# Patient Record
Sex: Male | Born: 1975 | Race: White | Hispanic: No | Marital: Married | Smoking: Former smoker
Health system: Southern US, Community
[De-identification: ages and names within clinical notes are randomized; demographics above are authoritative.]

## PROBLEM LIST (undated history)

## (undated) DIAGNOSIS — F32A Depression, unspecified: Secondary | ICD-10-CM

## (undated) DIAGNOSIS — F419 Anxiety disorder, unspecified: Secondary | ICD-10-CM

## (undated) DIAGNOSIS — F329 Major depressive disorder, single episode, unspecified: Secondary | ICD-10-CM

## (undated) DIAGNOSIS — Z9189 Other specified personal risk factors, not elsewhere classified: Secondary | ICD-10-CM

## (undated) HISTORY — DX: Anxiety disorder, unspecified: F41.9

## (undated) HISTORY — DX: Other specified personal risk factors, not elsewhere classified: Z91.89

## (undated) HISTORY — DX: Depression, unspecified: F32.A

## (undated) HISTORY — DX: Major depressive disorder, single episode, unspecified: F32.9

---

## 2001-09-19 ENCOUNTER — Encounter: Payer: Self-pay | Admitting: Emergency Medicine

## 2001-09-19 ENCOUNTER — Emergency Department (HOSPITAL_COMMUNITY): Admission: EM | Admit: 2001-09-19 | Discharge: 2001-09-19 | Payer: Self-pay | Admitting: Emergency Medicine

## 2003-01-24 ENCOUNTER — Ambulatory Visit (HOSPITAL_COMMUNITY): Admission: RE | Admit: 2003-01-24 | Discharge: 2003-01-24 | Payer: Self-pay | Admitting: Family Medicine

## 2003-01-24 ENCOUNTER — Emergency Department (HOSPITAL_COMMUNITY): Admission: EM | Admit: 2003-01-24 | Discharge: 2003-01-24 | Payer: Self-pay | Admitting: Physical Therapy

## 2004-11-04 ENCOUNTER — Emergency Department: Payer: Self-pay | Admitting: Emergency Medicine

## 2004-11-14 ENCOUNTER — Ambulatory Visit (HOSPITAL_BASED_OUTPATIENT_CLINIC_OR_DEPARTMENT_OTHER): Admission: RE | Admit: 2004-11-14 | Discharge: 2004-11-14 | Payer: Self-pay | Admitting: Orthopedic Surgery

## 2009-06-22 ENCOUNTER — Ambulatory Visit: Payer: Self-pay

## 2009-06-22 ENCOUNTER — Ambulatory Visit: Payer: Self-pay | Admitting: Cardiology

## 2009-06-22 ENCOUNTER — Encounter (INDEPENDENT_AMBULATORY_CARE_PROVIDER_SITE_OTHER): Payer: Self-pay | Admitting: Internal Medicine

## 2009-06-22 ENCOUNTER — Encounter: Payer: Self-pay | Admitting: Cardiology

## 2010-07-18 ENCOUNTER — Ambulatory Visit: Payer: Self-pay | Admitting: Family Medicine

## 2010-07-18 DIAGNOSIS — F341 Dysthymic disorder: Secondary | ICD-10-CM

## 2010-07-18 DIAGNOSIS — Z9189 Other specified personal risk factors, not elsewhere classified: Secondary | ICD-10-CM | POA: Insufficient documentation

## 2010-11-13 NOTE — Assessment & Plan Note (Signed)
Summary: NEW PATIENT APPT/[facility]   Vital Signs:  Patient profile:   35 year old male Height:      69 inches Weight:      147.0 pounds BMI:     21.79 Temp:     98.6 degrees F oral Pulse rate:   76 / minute Pulse rhythm:   regular BP sitting:   108 / 72  (left arm) Cuff size:   regular  Vitals Entered By: Benny Lennert CMA Duncan Dull) (July 18, 2010 1:51 PM)  History of Present Illness: Chief complaint new patient to be established   35 year old male:  Was seeing Dr. Amaryllis Dyke.  had some blood in his semen - had a big work-up to evaluate, neg STD testing, neg HIV, etc. trying to have a child right now.  does shop - in [place].  every once in a while has some pain with doing bench presses. Every once in a while will get more of a sharp anterior chest pain neg stress test ordered in the past.     Preventive Screening-Counseling & Management  Alcohol-Tobacco     Smoking Status: current  Caffeine-Diet-Exercise     Does Patient Exercise: yes      Drug Use:  no.    Allergies (verified): No Known Drug Allergies  Past History:  Past Medical History: DEPRESSION/ANXIETY (ICD-300.4) CHICKENPOX, HX OF (ICD-V15.9) R inguinal hernia    Psychiatry = Dr. Evelene Croon  Past Surgical History: none  Family History: Family History of Alcoholism/Addiction Family History of Arthritis Family History of Prostate CA 1st degree relative <50  Social History: Occupation: Systems analyst, [employer] Married Current Smoker Alcohol use-yes Drug use-no Regular exercise-yes Occupation:  employed Drug Use:  no Does Patient Exercise:  yes  Review of Systems  General: Denies fever, chills, sweats, and anorexia. Eyes: Denies blurring. ENT: Denies earache, ear discharge, decreased hearing, nasal congestion, and sore throat. CV: Denies chest pains, dyspnea on exertion, palpitations, and syncope. Resp: Denies cough, cough with exercise, dyspnea at rest, excessive sputum, nighttime cough or wheeze, and  wheezing GI: Denies nausea, vomiting, diarrhea, constipation, change in bowel habits, abdominal pain, melena, BRBPR GU: dysuria, discharge, frequency,genital sores, STD concern. MS: no back pain, joint pain, stiffness, and arthritis - CHEST PAIN, MSK EVERY SO OFTEN Derm: No rash, itching, and dryness Neuro: No abnormal gait, frequent headaches, paresthesias, seizures, vertigo, and weakness Psych: No anxiety, behavioral problems, compulsive behavior, depression, hyperactivity, and inattentive. Endo: No polydipsia, polyphagia, polyuria, and unusual weight change Heme: No bruising or LAD Allergy: No urticaria or hayfever   Otherwise, the pertinent positives and negatives are listed above and in the HPI, otherwise a full review of systems has been reviewed and is negative unless noted positive.   Physical Exam  General:  Well-developed,well-nourished,in no acute distress; alert,appropriate and cooperative throughout examination Head:  Normocephalic and atraumatic without obvious abnormalities. No apparent alopecia or balding. Eyes:  pupils equal, pupils round, pupils reactive to light, and pupils react to accomodation.   Ears:  External ear exam shows no significant lesions or deformities.  Otoscopic examination reveals inflammed canals, some wax, minimally ttp Nose:  External nasal examination shows no deformity or inflammation. Nasal mucosa are pink and moist without lesions or exudates. Mouth:  Oral mucosa and oropharynx without lesions or exudates.  Teeth in good repair. Neck:  No deformities, masses, or tenderness noted. Chest Wall:  No deformities, masses, tenderness or gynecomastia noted. Lungs:  Normal respiratory effort, chest expands symmetrically. Lungs are clear  to auscultation, no crackles or wheezes. Heart:  Normal rate and regular rhythm. S1 and S2 normal without gallop, murmur, click, rub or other extra sounds. Abdomen:  Bowel sounds positive,abdomen soft and non-tender without  masses, organomegaly or hernias noted. Genitalia:  R inguinal hernia  circumcised, no scrotal masses, no cutaneous lesions, and no urethral discharge.   Msk:  normal ROM and no crepitation.   Extremities:  No clubbing, cyanosis, edema, or deformity noted with normal full range of motion of all joints.   Neurologic:  alert & oriented X3 and gait normal.   Skin:  Intact without suspicious lesions or rashes Cervical Nodes:  No lymphadenopathy noted Inguinal Nodes:  No significant adenopathy Psych:  Cognition and judgment appear intact. Alert and cooperative with normal attention span and concentration. No apparent delusions, illusions, hallucinations   Impression & Recommendations:  Problem # 1:  HEALTH MAINTENANCE EXAM (ICD-V70.0) The patient's preventative maintenance and recommended screening tests for an annual wellness exam were reviewed in full today. Brought up to date unless services declined.  Counselled on the importance of diet, exercise, and its role in overall health and mortality. The patient's FH and SH was reviewed, including their home life, tobacco status, and drug and alcohol status.   We will obtain records from the patient's prior physicians.   mild oe, drops  Complete Medication List: 1)  Alprazolam 0.25 Mg Tabs (Alprazolam) .... Patient not sure of the doseage 2)  Cortisporin Otic  .Marland Kitchen.. 4 drops each ear three times a day for 7 days Prescriptions: CORTISPORIN OTIC 4 drops each ear three times a day for 7 days  #1 x 0   Entered and Authorized by:   Hannah Beat MD   Signed by:   Hannah Beat MD on 07/18/2010   Method used:   Print then Give to Patient   RxID:   [id number]   Current Allergies (reviewed today): No known allergies

## 2011-03-01 NOTE — Op Note (Signed)
Andrew Mcdonald, COOPRIDER             ACCOUNT NO.:  [id number]   MEDICAL RECORD NO.:  [id number]          PATIENT TYPE:  AMB   LOCATION:  [facility]                          FACILITY:  [facility]   PHYSICIAN:  Feliberto Gottron. Turner Daniels, M.D.   DATE OF BIRTH:  1975-12-24   DATE OF PROCEDURE:  11/14/2004  DATE OF DISCHARGE:  11/14/2004                                 OPERATIVE REPORT   PREOPERATIVE DIAGNOSIS:  Possible bucket-handle tear of the right knee  medial meniscus, possible anterior cruciate ligament tear.   POSTOPERATIVE DIAGNOSIS:  Right knee synovitis and partial anterior cruciate  ligament tear.   PROCEDURE:  Right knee arthroscopic synovectomy and debridement of partial-  thickness anterior cruciate ligament tear.   ANESTHETIC:  General LMA.   SURGEON:  Feliberto Gottron. Turner Daniels, M.D.   FIRST ASSISTANT:   ESTIMATED BLOOD LOSS:  Minimal.   FLUID REPLACEMENT:  7 cc crystalloid.   INDICATIONS FOR PROCEDURE:  A 35 year old man who hyperextended his right  knee with a fairly loud pop 10 days prior to surgery. He was seen in my  office and had a locked right knee, could not extend past 30 or flex past  about 50 degrees.  Plain radiographs were unremarkable. I did remove about  120 cc of blood without fat bodies from his knee and his knee remained  locked despite the instillation of Xylocaine and Marcaine. He is taken for  arthroscopic evaluation and treatment of a presumed medial meniscal tear,  locked and possible ACL tear.   DESCRIPTION OF PROCEDURE:  The patient identified by armband, taken the  operating room at [facility]. Appropriate monitors were  attached and general LMA anesthesia induced with the patient in supine  position.  Even after the induction of the general LMA anesthesia, his knee  would not come to full extension and when we attempted to, he would actually  start to shake a little bit under the anesthetic. In any event, tourniquet  was applied high to the right  leg, a lateral post applied to the table and  the right lower extremity prepped, draped in usual sterile fashion from the  ankle to the tourniquet. The tourniquet was never used.  Using a #11 blade,  standard inferomedial, inferolateral peripatellar portals were made allowing  evacuation of more recurrent hematoma.  The suprapatellar pouch, patella,  and trochlea only had some synovitis that was not severe in this region and  did not require debridement. Moving the medial compartment, the articular  and meniscal cartilages were in good condition.  In the prepatellar fat pad  region there was extensive synovitis to all the way up to the ACL which had  about 5 to 10% of the fibers torn and these were cleaned.  After this was  removed the knee had remarkably better motion. Moving to the lateral  compartment, the articular and meniscal cartilages were good condition.  There was no bucket-handle tear. The gutters were cleared medial and  laterally. The scope was taken medial and lateral to the PCL and no  posterior horn tears were identified.  The knee  was then irrigated out  normal saline solution. The arthroscopic  instruments were removed.  A dressing of Xeroform, 4 x 4 dressing sponges,  Webril and Ace wrap applied. The knee did now come to full extension after  removal of the partial ACL tear and the extensive synovitis.  The patient  was awakened and taken to recovery room without difficulty.      FJR/MEDQ  D:  11/26/2004  T:  11/26/2004  Job:  [id number]

## 2011-05-16 ENCOUNTER — Encounter: Payer: Self-pay | Admitting: Family Medicine

## 2011-05-16 ENCOUNTER — Ambulatory Visit (INDEPENDENT_AMBULATORY_CARE_PROVIDER_SITE_OTHER): Payer: 59 | Admitting: Family Medicine

## 2011-05-16 VITALS — BP 104/72 | HR 56 | Temp 97.8°F | Wt 143.2 lb

## 2011-05-16 DIAGNOSIS — H60399 Other infective otitis externa, unspecified ear: Secondary | ICD-10-CM

## 2011-05-16 DIAGNOSIS — H60312 Diffuse otitis externa, left ear: Secondary | ICD-10-CM | POA: Insufficient documentation

## 2011-05-16 DIAGNOSIS — H6090 Unspecified otitis externa, unspecified ear: Secondary | ICD-10-CM | POA: Insufficient documentation

## 2011-05-16 MED ORDER — OFLOXACIN 0.3 % OT SOLN: 10.0000 [drp] | Freq: Every day | OTIC | Status: AC

## 2011-05-16 NOTE — Assessment & Plan Note (Signed)
Treat with ofloxacin. Advised out of water until improved. Advised if not improving as expected, return for further eval.  Other red flags to return discussed.

## 2011-05-16 NOTE — Progress Notes (Signed)
  Subjective:    Patient ID: Andrew Mcdonald, male    DOB: 1975/12/03, 35 y.o.   MRN: [id number]  HPI CC: "ear infection"  1 wk h/o R side ear sxs.  Muffled, small amt draining from ear, some popping.  Mild headaches, dizziness.  More mucous production when blowing nose than normal.  Minimal coughing.  Some decreased hearing comes and goes.  Started 1wk after swimming in parent's pond, states clean pond.  No fevers/chills, ST.  No tinnitus.  Has been taking neomycin/poly/HC drops x3 days (leftover from previous outer ear infection)  No sick contacts at home.  Smoking 1/3 ppd.  Going on vacation to beach this weekend.  Review of Systems Per HPI    Objective:   Physical Exam  Nursing note and vitals reviewed. Constitutional: He appears well-developed and well-nourished. No distress.  HENT:  Head: Normocephalic and atraumatic.  Right Ear: Hearing normal. There is drainage.  Left Ear: Hearing, tympanic membrane, external ear and ear canal normal.  Nose: Nose normal. No mucosal edema or rhinorrhea.  Mouth/Throat: Uvula is midline, oropharynx is clear and moist and mucous membranes are normal. No oropharyngeal exudate, posterior oropharyngeal edema, posterior oropharyngeal erythema or tonsillar abscesses.       + pus covering TM on right.  Cleaned with curette, able to visualize small portion of TM, no perf, good light reflex.  Eyes: Conjunctivae and EOM are normal. Pupils are equal, round, and reactive to light. No scleral icterus.  Neck: Normal range of motion. Neck supple.  Lymphadenopathy:    He has no cervical adenopathy.  Psychiatric: He has a normal mood and affect.          Assessment & Plan:

## 2011-05-16 NOTE — Patient Instructions (Addendum)
Outer ear infection- treat with antibiotic drops daily.  Lay on left side 20 min after placing drops, or cover ear with cotton swab after using antibiotic. If not improving as expected, worsening pain, or hearing changes, please return to be seen again. Swimmer's Ear (Otitis Externa) Otitis externa ("swimmer's ear") is a germ (bacterial) or fungal infection of the outer ear canal (from the eardrum to the outside of the ear). Swimming in dirty water may cause swimmer's ear. It also may be caused by moisture in the ear from water remaining after swimming or bathing. Often the first signs of infection may be itching in the ear canal. This may progress to ear canal swelling, redness, and pus drainage which may be signs of infection. HOME CARE INSTRUCTIONS  Apply the antibiotic drops to the ear canal as prescribed by your doctor.   This can be a very painful medical condition. A strong pain reliever may be prescribed.   Only take over-the-counter or prescription medicines for pain, discomfort, or fever as directed by your caregiver.   If your caregiver has given you a follow-up appointment, it is very important to keep that appointment. Not keeping the appointment could result in a chronic or permanent injury, pain, hearing loss and disability. If there is any problem keeping the appointment, you must call back to this facility for assistance.  PREVENTION  It is important to keep your ear dry. Use the corner of a towel to wick water out of the ear canal after swimming or bathing.   Avoid scratching in your ear. This can damage the ear canal or remove the protective wax lining the canal and make it easier for germs (bacteria) or a fungus to grow.   You may use ear drops made of rubbing alcohol and vinegar after swimming to prevent future "swimmer ear" infections. Make up a small bottle of equal parts white vinegar and alcohol. Put 3 or 4 drops into each ear after swimming.   Avoid swimming in lakes,  polluted water, or poorly chlorinated pools.  SEEK MEDICAL CARE IF:  An oral temperature above 101 develops.   Your ear is still painful after 3 days and shows signs of getting worse (redness, swelling, pain, or pus).  MAKE SURE YOU:   Understand these instructions.   Will watch your condition.   Will get help right away if you are not doing well or get worse.  Document Released: 09/30/2005 Document Re-Released: 09/12/2008 [facility] Patient Information 2011 [place], [place].

## 2011-08-09 ENCOUNTER — Ambulatory Visit (INDEPENDENT_AMBULATORY_CARE_PROVIDER_SITE_OTHER): Payer: 59 | Admitting: Family Medicine

## 2011-08-09 ENCOUNTER — Encounter: Payer: Self-pay | Admitting: Family Medicine

## 2011-08-09 VITALS — BP 116/70 | HR 60 | Temp 98.0°F | Wt 147.8 lb

## 2011-08-09 DIAGNOSIS — H60399 Other infective otitis externa, unspecified ear: Secondary | ICD-10-CM

## 2011-08-09 DIAGNOSIS — H6090 Unspecified otitis externa, unspecified ear: Secondary | ICD-10-CM

## 2011-08-09 MED ORDER — OFLOXACIN 0.3 % OT SOLN: 10.0000 [drp] | Freq: Every day | OTIC | Status: AC

## 2011-08-09 NOTE — Patient Instructions (Signed)
Looks like external ear infection again. Treat with ofloxacin daily for 7 days. Please notify us if not improving as expected.

## 2011-08-09 NOTE — Assessment & Plan Note (Signed)
External otitis. Treat with oflox. No perf today. Update if not improving.

## 2011-08-09 NOTE — Progress Notes (Signed)
  Subjective:    Patient ID: Andrew Mcdonald, male    DOB: Apr 08, 1976, 35 y.o.   MRN: [id number]  HPI CC: R ear ache  3d h/o ear pain and drainage, mild dizziness.  + congestion, RN.  + coughing some.  No hearing changes.  Tends to get ear infections.  Smoking 1/2 ppd.  No recent swimming.  No fevers/chills,   Seen here 05/2011 - treated for otitis externa with ofloxacin, improved.  Review of Systems Per HPI    Objective:   Physical Exam  Nursing note and vitals reviewed. Constitutional: He appears well-developed and well-nourished. No distress.  HENT:  Head: Normocephalic and atraumatic.  Left Ear: Hearing, tympanic membrane, external ear and ear canal normal.  Mouth/Throat: Oropharynx is clear and moist. No oropharyngeal exudate.       Wax in left ear obscuring most of TM R ear with external canal irritation as well as white discharge. TM pearly grey, did not visualize perf.  Eyes: Conjunctivae and EOM are normal. Pupils are equal, round, and reactive to light. No scleral icterus.  Neck: Normal range of motion. Neck supple.  Lymphadenopathy:    He has no cervical adenopathy.  Skin: Skin is warm and dry. No rash noted.  Psychiatric: He has a normal mood and affect.          Assessment & Plan:

## 2011-11-05 ENCOUNTER — Telehealth: Payer: Self-pay | Admitting: Family Medicine

## 2011-11-05 MED ORDER — CIPROFLOXACIN HCL 500 MG PO TABS: 500.0000 mg | ORAL_TABLET | Freq: Two times a day (BID) | ORAL | Status: AC

## 2011-11-05 NOTE — Telephone Encounter (Signed)
Did he not get at all better after abx drops?  Has had 2 cases of uncomplicated external otitis last year, 1 in 2011.  Last case was 07/2011.  First time treated with cortisporin, next 2 with oflox. Best would be (and i'd prefer) to come in for further evaluation given last episode was 3 + mo ago.  Can he come in tomorrow?  (could see PCP for this).  ?fungal vs other dx.   If refuses, can try treatment with cipro 500mg  bid x 1 wk (i sent in).

## 2011-11-05 NOTE — Telephone Encounter (Signed)
Message left for patient to return my call.  

## 2011-11-05 NOTE — Telephone Encounter (Signed)
Pt called has been seen 3 times for right ear pain and is becoming painful in both ears. Was given ear drops and those have not worked. Pt would like a antibiotic instead of ear drops.

## 2011-11-05 NOTE — Telephone Encounter (Signed)
Spoke with patient. He said he hasn't really gotten better since his last visit and feels like he has a lot of drainage. I advised he should be rechecked to make sure the cause isn't something else. He scheduled a recheck for tomorrow with Dr. Patsy Lager

## 2011-11-06 ENCOUNTER — Ambulatory Visit (INDEPENDENT_AMBULATORY_CARE_PROVIDER_SITE_OTHER): Payer: 59 | Admitting: Family Medicine

## 2011-11-06 ENCOUNTER — Encounter: Payer: Self-pay | Admitting: Family Medicine

## 2011-11-06 VITALS — BP 120/78 | HR 68 | Temp 98.1°F | Ht 67.0 in | Wt 147.0 lb

## 2011-11-06 DIAGNOSIS — H6690 Otitis media, unspecified, unspecified ear: Secondary | ICD-10-CM

## 2011-11-06 DIAGNOSIS — M7580 Other shoulder lesions, unspecified shoulder: Secondary | ICD-10-CM

## 2011-11-06 DIAGNOSIS — M67919 Unspecified disorder of synovium and tendon, unspecified shoulder: Secondary | ICD-10-CM

## 2011-11-06 MED ORDER — AMOXICILLIN 500 MG PO CAPS: 1000.0000 mg | ORAL_CAPSULE | Freq: Two times a day (BID) | ORAL | Status: AC

## 2011-11-06 NOTE — Progress Notes (Signed)
  Patient Name: Andrew Mcdonald Date of Birth: September 20, 1976 Age: 36 y.o. Medical Record Number: [id number] Gender: male Date of Encounter: 11/06/2011  History of Present Illness:  Andrew Mcdonald is a 36 y.o. very pleasant male patient who presents with the following:  R ear is hurting a lot, L ear a little bit.  Having a little trouble hearing and draining some   Back tooth is hurting.  A little   Pleasant gentleman who has had several cases of otitis externa within the last year. He has been treated with topical antibiotic drops. Right now he is having a return of some symptoms in a deep ache in his ear. He has had some dripping in his ear. No pain with movement of the ear itself as well as no pain with moving the tragus.  He also is having some left-sided shoulder pain has been in intermittent for a long time. Pain with throwing a baseball or softball. 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  Past Medical History, Surgical History, Social History, Family History, Problem List, Medications, and Allergies have been reviewed and updated if relevant.  Review of Systems: ROS: GEN: Acute illness details above GI: Tolerating PO intake GU: maintaining adequate hydration and urination Pulm: No SOB Interactive and getting along well at home.  Otherwise, ROS is as per the HPI.   Physical Examination: Filed Vitals:   11/06/11 1126  BP: 120/78  Pulse: 68  Temp: 98.1 F (36.7 C)  TempSrc: Oral  Height: 5\' 7"  (1.702 m)  Weight: 147 lb (66.679 kg)  SpO2: 98%    Body mass index is 23.02 kg/(m^2).   Gen: WDWN, NAD; A & O x3, cooperative. Pleasant.Globally Non-toxic HEENT: Normocephalic and atraumatic. Throat clear, w/o exudate, R canal is mildly inflamed. Nontender with movement of the entire ear or with movement of the tragus. Tympanic membrane is full, full fluid, and bulging. No rhinnorhea.  MMM Frontal sinuses: NT Max sinuses: NT NECK: Anterior cervical  LAD is  absent CV: RRR, No M/G/R, cap refill <2 sec PULM: Breathing comfortably in no respiratory distress. no wheezing, crackles, rhonchi EXT: No c/c/e PSYCH: Friendly, good eye contact MSK: Nml gait  Left shoulder with full range of motion. Upper extremity strength is 5/5. Negative drop test. Leanord Asal test is positive. Neer test is positive.   Assessment and Plan: 1. Otitis media  amoxicillin (AMOXIL) 500 MG capsule  2. Rotator cuff tendinitis      abx Reviewed throwers twelve

## 2012-02-18 ENCOUNTER — Ambulatory Visit (INDEPENDENT_AMBULATORY_CARE_PROVIDER_SITE_OTHER): Payer: 59 | Admitting: Family Medicine

## 2012-02-18 ENCOUNTER — Encounter: Payer: Self-pay | Admitting: Family Medicine

## 2012-02-18 VITALS — BP 112/70 | HR 68 | Temp 97.5°F | Wt 147.2 lb

## 2012-02-18 DIAGNOSIS — H60399 Other infective otitis externa, unspecified ear: Secondary | ICD-10-CM

## 2012-02-18 DIAGNOSIS — H6090 Unspecified otitis externa, unspecified ear: Secondary | ICD-10-CM

## 2012-02-18 NOTE — Progress Notes (Signed)
  Subjective:    Patient ID: Andrew Mcdonald, male    DOB: 23-Mar-1976, 36 y.o.   MRN: [id number]  HPI CC: ear pain  H/o recurrent external and middle ear infections in past, last otitis media was 10/2011, eval by PCP.  Comes and goes, today not bothering him.  But yesterday especially bad.  States never fully resolved after treatment 10/2011 with 10d course of amoxicillin 1000mg  bid.    Also seen here 05/2011 and 07/2011 with external ear infections, treated with ofloxacin both times with only partial resolution of sxs.  Feels fluid building up in right ear, muffled hearing R>L.    No fevers/chills.  + nasal congestion, coughing, mild tooth pain on right.  Smokes 1/3 ppd. No recent swimming.  Medications and allergies reviewed and updated in chart.  Past histories reviewed and updated if relevant as below. Patient Active Problem List  Diagnoses  . DEPRESSION/ANXIETY  . CHICKENPOX, HX OF  . External otitis   Past Medical History  Diagnosis Date  . Anxiety and depression   . Unspecified personal history presenting hazards to health   . Inguinal hernia     right   No past surgical history on file. History  Substance Use Topics  . Smoking status: Current Everyday Smoker -- 0.5 packs/day for 15 years    Types: Cigarettes  . Smokeless tobacco: Not on file  . Alcohol Use: Yes     Occasional   Family History  Problem Relation Age of Onset  . Alcohol abuse      family history  . Arthritis      family history  . Prostate cancer      1st degree relative <50   No Known Allergies Current Outpatient Prescriptions on File Prior to Visit  Medication Sig Dispense Refill  . ALPRAZolam (XANAX) 0.25 MG tablet Take 0.25 mg by mouth as directed.           Review of Systems Per HPI    Objective:   Physical Exam  Constitutional: He appears well-developed and well-nourished. No distress.  HENT:  Head: Normocephalic and atraumatic.  Right Ear: Hearing and external ear normal.    Left Ear: Hearing and external ear normal.  Nose: Nose normal. No mucosal edema or rhinorrhea. Right sinus exhibits no maxillary sinus tenderness and no frontal sinus tenderness. Left sinus exhibits no maxillary sinus tenderness and no frontal sinus tenderness.  Mouth/Throat: Uvula is midline, oropharynx is clear and moist and mucous membranes are normal. No oropharyngeal exudate, posterior oropharyngeal edema, posterior oropharyngeal erythema or tonsillar abscesses.       Left external canal covered by dark yellow cerumen, cleaning attempted with plastic curette but poorly tolerated, able to visualize TM - pearly grey, no bulging or erythema Right external canal full of white purulent material, unable to clean out fully but portion of TM visualized - white/grey as well, no erythema.  Eyes: Conjunctivae and EOM are normal. Pupils are equal, round, and reactive to light. No scleral icterus.  Neck: Normal range of motion. Neck supple.  Lymphadenopathy:    He has no cervical adenopathy.  Skin: Skin is warm and dry. No rash noted.      Assessment & Plan:

## 2012-02-18 NOTE — Patient Instructions (Signed)
Pass by Marion's office for referral to ENT.

## 2012-02-19 NOTE — Assessment & Plan Note (Signed)
Recurrent otitis externa over last several months, always R>L side. Today significant debris bilaterally, likely impeding ability of abx drops to affect cure.  Prior treated with cortisporin then oflox drops. Concern for chronic external otitis vs other cause (ie fungal) Will refer to ENT for external canal cleaning as well as further eval and treatment.

## 2012-03-05 ENCOUNTER — Ambulatory Visit: Payer: Self-pay | Admitting: Otolaryngology

## 2012-05-11 ENCOUNTER — Ambulatory Visit (INDEPENDENT_AMBULATORY_CARE_PROVIDER_SITE_OTHER): Payer: 59 | Admitting: Family Medicine

## 2012-05-11 ENCOUNTER — Encounter: Payer: Self-pay | Admitting: Family Medicine

## 2012-05-11 VITALS — BP 100/74 | HR 60 | Temp 98.5°F | Ht 67.5 in | Wt 144.8 lb

## 2012-05-11 DIAGNOSIS — Z0000 Encounter for general adult medical examination without abnormal findings: Secondary | ICD-10-CM

## 2012-05-11 DIAGNOSIS — Z139 Encounter for screening, unspecified: Secondary | ICD-10-CM

## 2012-05-11 NOTE — Progress Notes (Signed)
Nature conservation officer at [facility] [address] [place] [address] Phone: [phone] Fax: [phone]  Date:  05/11/2012   Name:  Andrew Mcdonald   DOB:  April 17, 1976   MRN:  [id number]  PCP:  Hannah Beat, MD    Chief Complaint: Annual Exam   History of Present Illness:  Andrew Mcdonald is a 36 y.o. very pleasant male patient who presents with the following:  Quitting smoking. Had tetanus at [facility].   Preventative Health Maintenance Visit:  Health Maintenance Summary Reviewed and updated, unless pt declines services.  Tobacco History Reviewed. Alcohol: No concerns, no excessive use Exercise Habits: very active STD concerns: no risk or activity to increase risk Drug Use: None Encouraged self-testicular check  Health Maintenance  Topic Date Due  . Influenza Vaccine  07/14/2012  . Tetanus/tdap  08/11/2021   Extensive bloodwork during conception of child and infertility, all of which was normal.  Past Medical History, Surgical History, Social History, Family History, Problem List, Medications, and Allergies have been reviewed and updated if relevant.  Current Outpatient Prescriptions on File Prior to Visit  Medication Sig Dispense Refill  . ALPRAZolam (XANAX) 0.25 MG tablet Take 0.25 mg by mouth 4 (four) times daily as needed.         Review of Systems:  General: Denies fever, chills, sweats. No significant weight loss. Eyes: Denies blurring,significant itching ENT: Denies earache, sore throat, and hoarseness. Cardiovascular: Denies chest pains, palpitations, dyspnea on exertion Respiratory: Denies cough, dyspnea at rest,wheeezing Breast: no concerns about lumps GI: Denies nausea, vomiting, diarrhea, constipation, change in bowel habits, abdominal pain, melena, hematochezia GU: Denies penile discharge, ED, urinary flow / outflow problems. No STD concerns. Musculoskeletal: Denies back pain, joint pain Derm: Denies rash, itching Neuro: Denies   paresthesias, frequent falls, frequent headaches Psych: Denies depression, anxiety Endocrine: Denies cold intolerance, heat intolerance, polydipsia Heme: Denies enlarged lymph nodes Allergy: No hayfever   Physical Examination: Filed Vitals:   05/11/12 1204  BP: 100/74  Pulse: 60  Temp: 98.5 F (36.9 C)   Filed Vitals:   05/11/12 1204  Height: 5' 7.5" (1.715 m)  Weight: 144 lb 12 oz (65.658 kg)   Body mass index is 22.34 kg/(m^2). Ideal Body Weight: Weight in (lb) to have BMI = 25: 161.7   GEN: well developed, well nourished, no acute distress Eyes: conjunctiva and lids normal, PERRLA, EOMI ENT: TM clear, nares clear, oral exam WNL Neck: supple, no lymphadenopathy, no thyromegaly, no JVD Pulm: clear to auscultation and percussion, respiratory effort normal CV: regular rate and rhythm, S1-S2, no murmur, rub or gallop, no bruits, peripheral pulses normal and symmetric, no cyanosis, clubbing, edema or varicosities GI: soft, non-tender; no hepatosplenomegaly, masses; active bowel sounds all quadrants GU: defer Lymph: no cervical, axillary or inguinal adenopathy MSK: gait normal, muscle tone and strength WNL, no joint swelling, effusions, discoloration, crepitus  SKIN: clear, good turgor, color WNL, no rashes, lesions, or ulcerations Neuro: normal mental status, normal strength, sensation, and motion Psych: alert; oriented to person, place and time, normally interactive and not anxious or depressed in appearance.   Assessment and Plan: 1. Routine general medical examination at a health care facility    2. Screening  TB Skin Test    The patient's preventative maintenance and recommended screening tests for an annual wellness exam were reviewed in full today. Brought up to date unless services declined.  Counselled on the importance of diet, exercise, and its role in overall health and  mortality. The patient's FH and SH was reviewed, including their home life, tobacco status,  and drug and alcohol status.   Forms completed, he will return for PPD reading Wed-Thurs.  Hannah Beat, MD

## 2012-05-14 LAB — TB SKIN TEST
Induration: 0 mm
TB Skin Test: NEGATIVE

## 2012-09-30 IMAGING — CT CT ORBITS WITHOUT CONTRAST
3 of 4 series · 16 of 30 positions shown, 18 images · non-contrast
Comparison: none

REASON FOR EXAM: Otorrhea Possible Cholesteatoma
COMMENTS:

[Series 4: right coronal · axial · 0.19mm/px · z∈[+327,+396]mm · 7 of 154 slices shown, 9 images]
[im 20/154  brain]
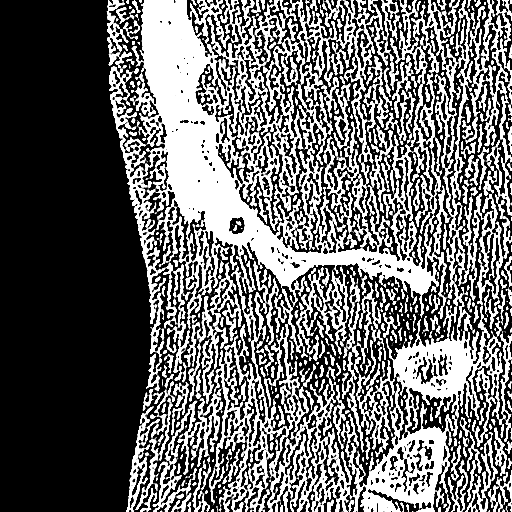
[im 20/154  bone]
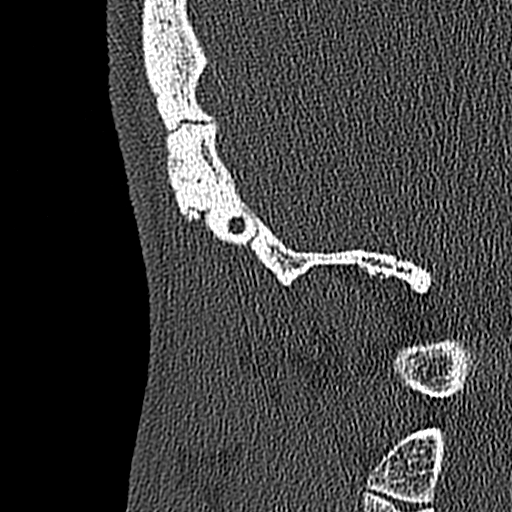
[im 39/154  bone]
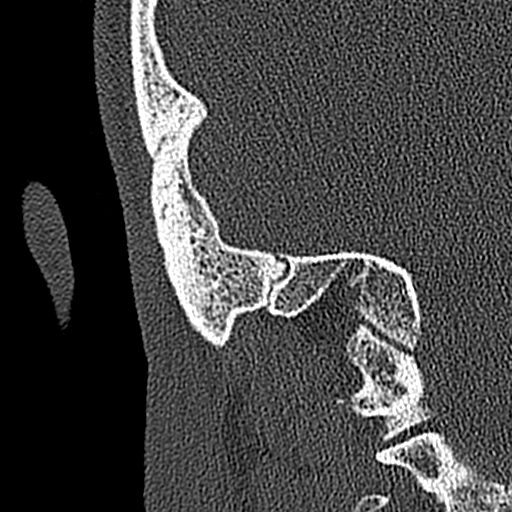
[im 58/154  bone]
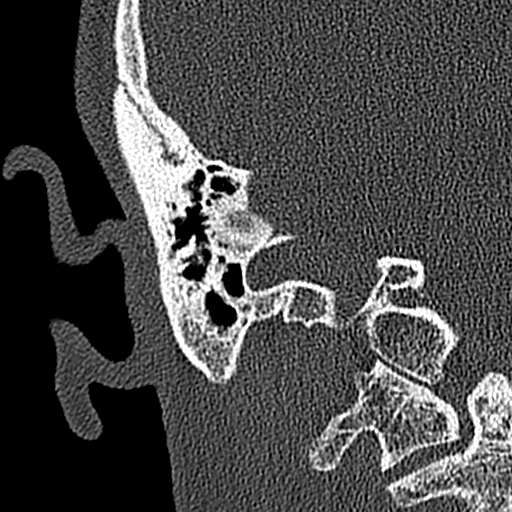
[im 77/154  bone]
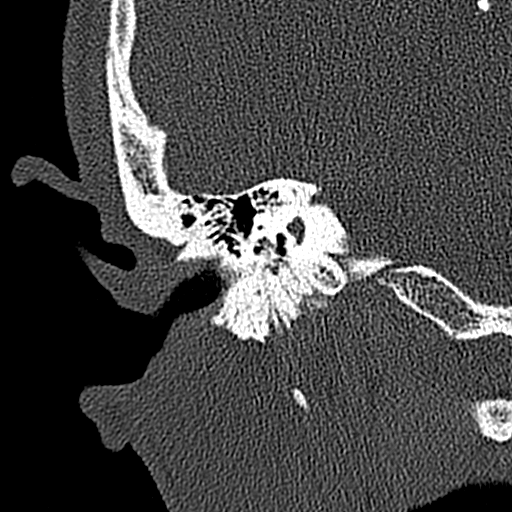
[im 96/154  brain]
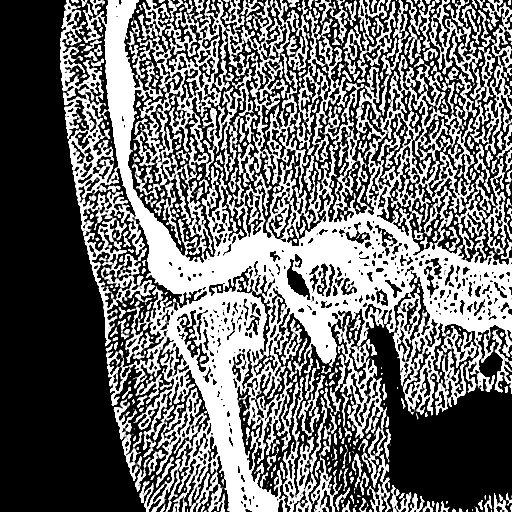
[im 96/154  bone]
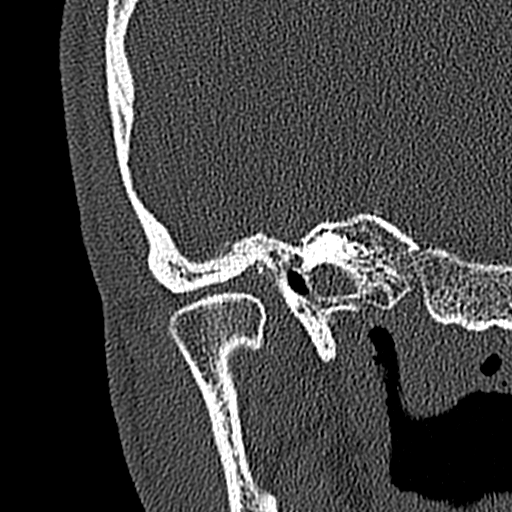
[im 115/154  bone]
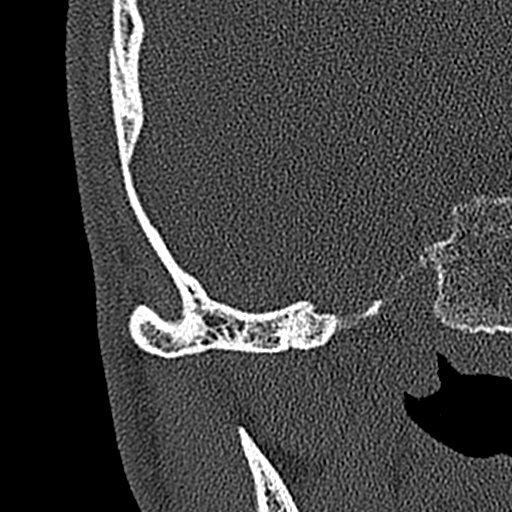
[im 134/154  bone]
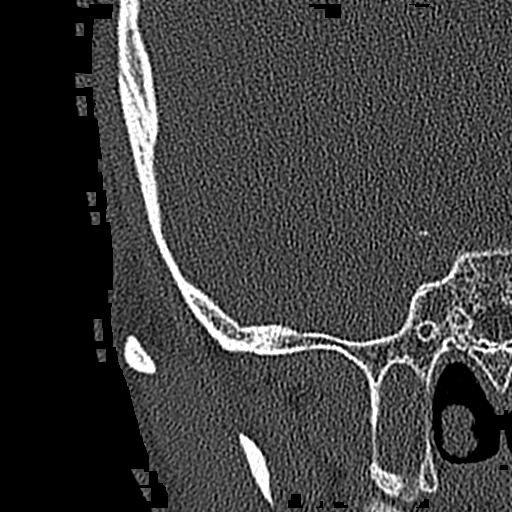

[Series 5: left coronal · axial · 0.19mm/px · z∈[+328,+398]mm · 7 of 157 slices shown]
[im 20/157  bone]
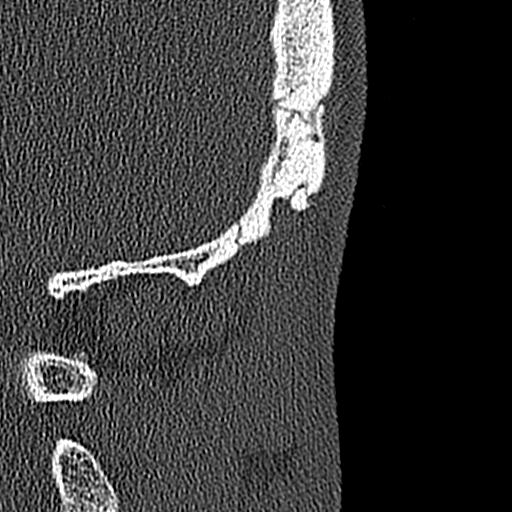
[im 40/157  bone]
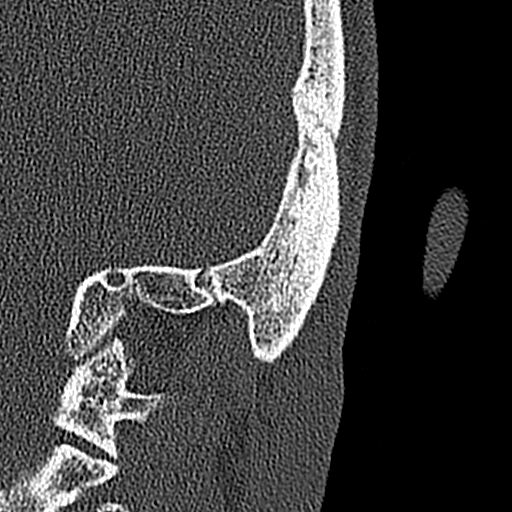
[im 59/157  bone]
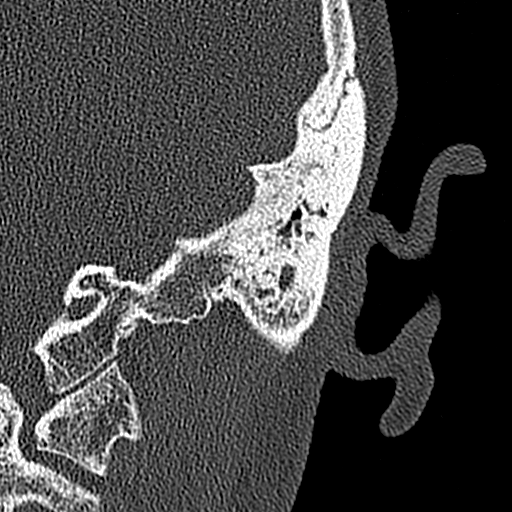
[im 79/157  bone]
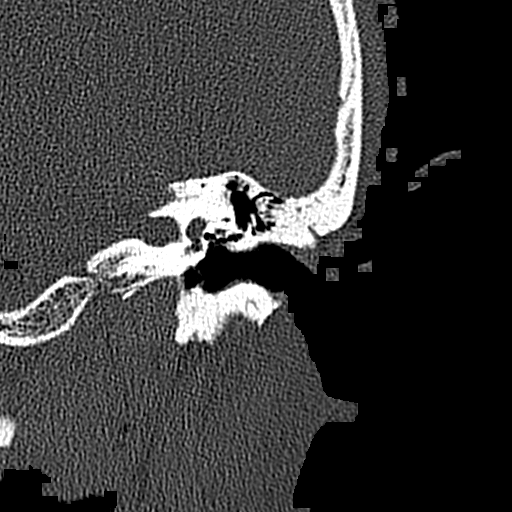
[im 98/157  bone]
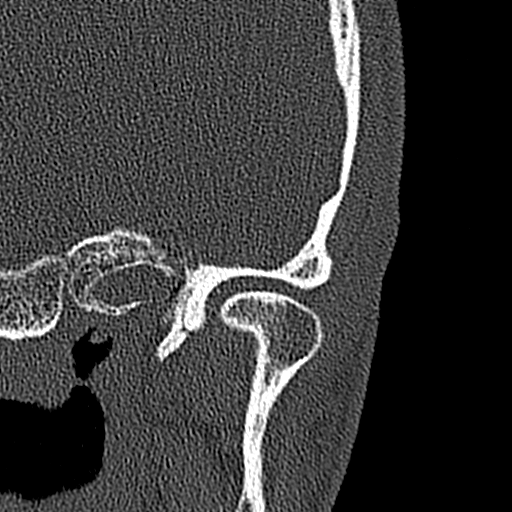
[im 118/157  bone]
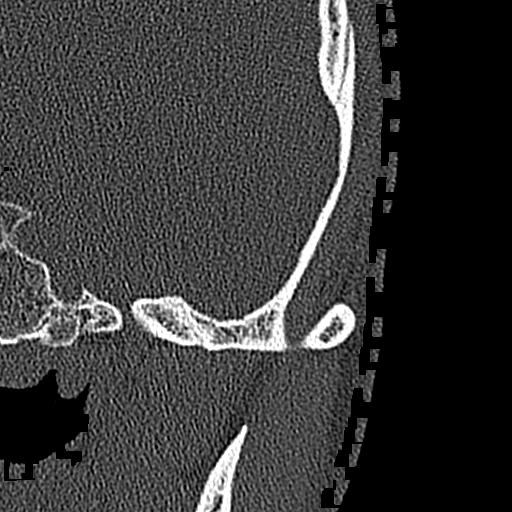
[im 137/157  bone]
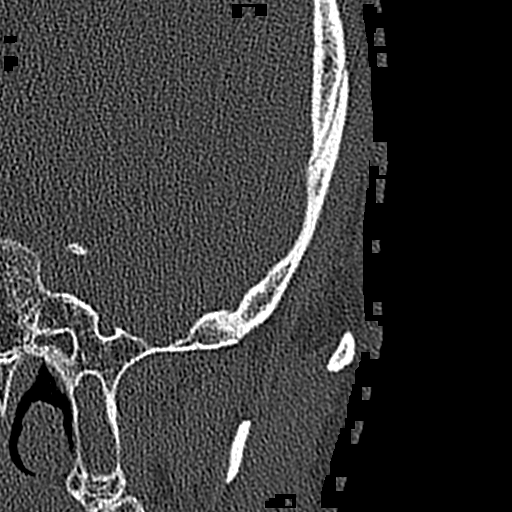

[Series 7: right axial · axial · 0.19mm/px · z∈[+318,+330]mm · 2 of 101 slices shown]
[im 21/101  bone]
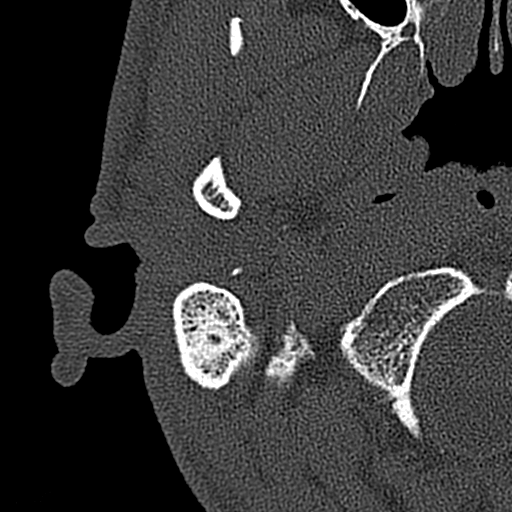
[im 41/101  bone]
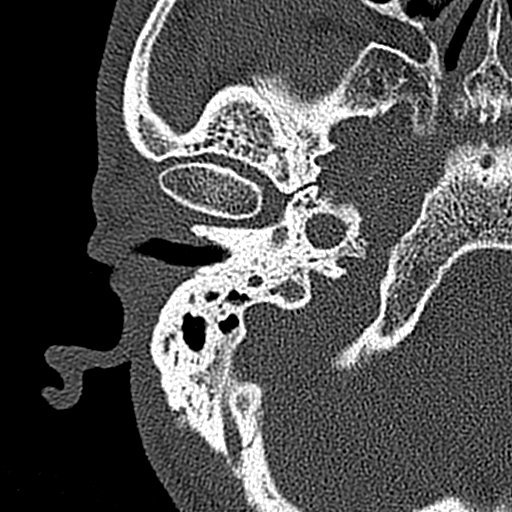

[16 of 30 positions shown; findings below may reference images not displayed]

PROCEDURE:     KCT - KCT ORBITS OR TEMPORAL BONE WO  - March 05, 2012  [DATE]

RESULT:     Direct axial and coronal CT through the temporal bones is
reconstructed utilizing a high-resolution bone algorithm at 0.6 mm slice
thickness in the axial and coronal planes. There is no previous exam for
comparison.

The left side images demonstrate some sclerosis and partial opacification
within the mastoid air cells. There is no erosion of the bony septations
within the mastoid. There is density within the external auditory canal
which could represent cerumen or mucosal thickening. The tympanic membrane
appears to be within normal limits. There is some minimal soft tissue
density within the epitympanic space seen on the axial scan between images
#57 and #63 and on the coronal scan between images #88 and #91. The ossicles
appear to be intact and normally aligned. The scutum is not eroded. The
cochlea, vestibule and semicircular canals as well as the vestibular
aqueduct appear normal. The facial nerve canal is identified and intact. The
bony carotid canal appears intact.

On the right side the mastoid air cells are well aerated. The bony septa are
not eroded. There is thickening within the external auditory canal to a
greater degree than seen on the left side. Cerumen or mucosal thickening are
differential considerations. The tympanic membrane may be minimally
thickened. The bony ossicles appear to be normally aligned. The scutum is
intact. The cochlea, vestibule, semicircular canals and vestibular aqueduct
appear intact. The facial nerve canal is intact. The internal carotid canal
appears intact. The visualized temporomandibular joints and paranasal
sinuses appear unremarkable.
IMPRESSION: 1. Mucosal thickening and cerumen within the external auditory canals.
2. Possible granulation tissue within the anterior aspect of the left
epitympanic space with some minimal mastoid inflammation.

[REDACTED]

## 2012-10-26 ENCOUNTER — Encounter: Payer: Self-pay | Admitting: Family Medicine

## 2012-10-26 ENCOUNTER — Ambulatory Visit (INDEPENDENT_AMBULATORY_CARE_PROVIDER_SITE_OTHER): Payer: BC Managed Care – PPO | Admitting: Family Medicine

## 2012-10-26 VITALS — BP 120/78 | HR 66 | Temp 98.6°F | Ht 67.5 in | Wt 143.8 lb

## 2012-10-26 DIAGNOSIS — R599 Enlarged lymph nodes, unspecified: Secondary | ICD-10-CM

## 2012-10-26 DIAGNOSIS — H60399 Other infective otitis externa, unspecified ear: Secondary | ICD-10-CM

## 2012-10-26 DIAGNOSIS — Z23 Encounter for immunization: Secondary | ICD-10-CM

## 2012-10-26 DIAGNOSIS — L02821 Furuncle of head [any part, except face]: Secondary | ICD-10-CM

## 2012-10-26 DIAGNOSIS — H6090 Unspecified otitis externa, unspecified ear: Secondary | ICD-10-CM

## 2012-10-26 DIAGNOSIS — L02838 Carbuncle of other sites: Secondary | ICD-10-CM

## 2012-10-26 MED ORDER — NEOMYCIN-POLYMYXIN-HC 3.5-10000-1 OT SOLN: 3.0000 [drp] | Freq: Four times a day (QID) | OTIC | Status: DC

## 2012-10-26 NOTE — Progress Notes (Signed)
[facility] [address] [place] [address] Phone: [phone] Fax: [phone]  Date:  10/26/2012   Name:  Andrew Mcdonald   DOB:  08-11-1976   MRN:  [id number] Gender: male Age: 37 y.o.  PCP:  Hannah Beat, MD  Evaluating MD: Hannah Beat, MD   Chief Complaint: painful knot on back of head   History of Present Illness:  Andrew Mcdonald is a 37 y.o. pleasant patient who presents with the following:  Several issues:  1. R posterior LAD, posterior R lower side of head. < 1 dime size Boil, several CM caudal to this Infected cyst, also behind the R ear, as well  Otitis Externa, recurrent, has used Ciprodex, given by ENT when has wet ears, but out now and could not afford to fill it.  Patient Active Problem List  Diagnosis  . DEPRESSION/ANXIETY    Past Medical History  Diagnosis Date  . Anxiety and depression   . Unspecified personal history presenting hazards to health   . Inguinal hernia     right    No past surgical history on file.  History  Substance Use Topics  . Smoking status: Current Every Day Smoker -- 0.5 packs/day for 15 years    Types: Cigarettes  . Smokeless tobacco: Never Used  . Alcohol Use: No    Family History  Problem Relation Age of Onset  . Alcohol abuse      family history  . Arthritis      family history  . Prostate cancer      1st degree relative <50    No Known Allergies  Medication list has been reviewed and updated.  Outpatient Prescriptions Prior to Visit  Medication Sig Dispense Refill  . ALPRAZolam (XANAX) 0.25 MG tablet Take 0.25 mg by mouth 4 (four) times daily as needed.        Last reviewed on 10/26/2012  2:35 PM by Consuello Masse, CMA  Review of Systems:  ROS: GEN: Acute illness details above GI: Tolerating PO intake GU: maintaining adequate hydration and urination Pulm: No SOB Interactive and getting along well at home.  Otherwise, ROS is as per the HPI.    Physical Examination: BP 120/78  Pulse 66  Temp 98.6 F (37 C) (Oral)  Ht 5' 7.5" (1.715 m)  Wt 143 lb 12 oz (65.205 kg)  BMI 22.18 kg/m2  SpO2 99%  Ideal Body Weight: Weight in (lb) to have BMI = 25: 161.7    GEN: WDWN, NAD, Non-toxic, Alert & Oriented x 3 HEENT: Atraumatic, Normocephalic.  Ears and Nose: No external deformity. R posterior to ear with reddish enlargement, 3 mm across, crusting. LAD / vs small cyst posterior occiput. In scalp, healing boil caudal to this. R Canal enlarged, swollen, fluid. EXTR: No clubbing/cyanosis/edema NEURO: Normal gait.  PSYCH: Normally interactive. Conversant. Not depressed or anxious appearing.  Calm demeanor.    Assessment and Plan:  1. Lymph node enlargement    2. Need for prophylactic vaccination and inoculation against influenza  Flu vaccine greater than or equal to 3yo preservative free IM  3. Boil of head or scalp    4. Otitis externa     Reassured about LAD, I expect will resolve. If not better in 2-3 weeks will call or if worsens, and I could place on augmentin.  OE, recurrent, for price, hopefully cortisporin will help.  Orders Today:  Orders Placed This Encounter  Procedures  . Flu vaccine  greater than or equal to 3yo preservative free IM    Updated Medication List: (Includes new medications, updates to list, dose adjustments) Meds ordered this encounter  Medications  . neomycin-polymyxin-hydrocortisone (CORTISPORIN) otic solution    Sig: Place 3 drops into the right ear 4 (four) times daily. Or as directed    Dispense:  10 mL    Refill:  0    Medications Discontinued: There are no discontinued medications.   Hannah Beat, MD

## 2014-07-12 ENCOUNTER — Encounter: Payer: Self-pay | Admitting: Family Medicine

## 2014-07-12 ENCOUNTER — Ambulatory Visit (INDEPENDENT_AMBULATORY_CARE_PROVIDER_SITE_OTHER): Payer: BC Managed Care – PPO | Admitting: Family Medicine

## 2014-07-12 VITALS — BP 121/72 | HR 57 | Temp 98.5°F | Ht 70.0 in | Wt 139.0 lb

## 2014-07-12 DIAGNOSIS — J069 Acute upper respiratory infection, unspecified: Secondary | ICD-10-CM | POA: Insufficient documentation

## 2014-07-12 DIAGNOSIS — B9789 Other viral agents as the cause of diseases classified elsewhere: Principal | ICD-10-CM

## 2014-07-12 MED ORDER — GUAIFENESIN-CODEINE 100-10 MG/5ML PO SYRP: 5.0000 mL | ORAL_SOLUTION | Freq: Every evening | ORAL | Status: DC | PRN

## 2014-07-12 NOTE — Progress Notes (Signed)
Pre visit review using our clinic review tool, if applicable. No additional management support is needed unless otherwise documented below in the visit note. 

## 2014-07-12 NOTE — Assessment & Plan Note (Signed)
Symtpoma care. Will also suggest antihistamine in case allergy component. Sent in rx for cough suppressant to use at night.

## 2014-07-12 NOTE — Progress Notes (Signed)
   Subjective:    Patient ID: Andrew Mcdonald, male    DOB: 04/07/1976, 38 y.o.   MRN: [id number]  Cough This is a new problem. The current episode started in the past 7 days (2 days). The problem has been gradually worsening. The problem occurs constantly. The cough is productive of sputum. Associated symptoms include chest pain, myalgias, nasal congestion, shortness of breath and wheezing. Pertinent negatives include no ear congestion, ear pain, fever, headaches, postnasal drip, rash or sore throat. Associated symptoms comments: Sneezing  chest sore with cough. The symptoms are aggravated by lying down (cannot sleep due to cough). Risk factors: former smoker >18 years. Treatments tried: generic dayquil. The treatment provided no relief. His past medical history is significant for environmental allergies. There is no history of asthma, bronchiectasis, bronchitis, COPD, emphysema or pneumonia.      Review of Systems  Constitutional: Negative for fever.  HENT: Negative for ear pain, postnasal drip and sore throat.   Respiratory: Positive for cough, shortness of breath and wheezing.   Cardiovascular: Positive for chest pain.  Musculoskeletal: Positive for myalgias.  Skin: Negative for rash.  Allergic/Immunologic: Positive for environmental allergies.  Neurological: Negative for headaches.       Objective:   Physical Exam  Constitutional: Vital signs are normal. He appears well-developed and well-nourished.  Non-toxic appearance. He does not appear ill. No distress.  HENT:  Head: Normocephalic and atraumatic.  Right Ear: Hearing, tympanic membrane, external ear and ear canal normal. No tenderness. No foreign bodies. Tympanic membrane is not retracted and not bulging.  Left Ear: Hearing, tympanic membrane, external ear and ear canal normal. No tenderness. No foreign bodies. Tympanic membrane is not retracted and not bulging.  Nose: Mucosal edema and rhinorrhea present. Right sinus exhibits  no maxillary sinus tenderness and no frontal sinus tenderness. Left sinus exhibits no maxillary sinus tenderness and no frontal sinus tenderness.  Mouth/Throat: Uvula is midline, oropharynx is clear and moist and mucous membranes are normal. Normal dentition. No dental caries. No oropharyngeal exudate or tonsillar abscesses.  Eyes: Conjunctivae, EOM and lids are normal. Pupils are equal, round, and reactive to light. Lids are everted and swept, no foreign bodies found.  Neck: Trachea normal, normal range of motion and phonation normal. Neck supple. Carotid bruit is not present. No mass and no thyromegaly present.  Cardiovascular: Normal rate, regular rhythm, S1 normal, S2 normal, normal heart sounds, intact distal pulses and normal pulses.  Exam reveals no gallop.   No murmur heard. Pulmonary/Chest: Effort normal and breath sounds normal. No respiratory distress. He has no wheezes. He has no rhonchi. He has no rales.  Abdominal: Soft. Normal appearance and bowel sounds are normal. There is no hepatosplenomegaly. There is no tenderness. There is no rebound, no guarding and no CVA tenderness. No hernia.  Neurological: He is alert. He has normal reflexes.  Skin: Skin is warm, dry and intact. No rash noted.  Psychiatric: He has a normal mood and affect. His speech is normal and behavior is normal. Judgment normal.          Assessment & Plan:

## 2016-02-23 ENCOUNTER — Encounter: Payer: Self-pay | Admitting: Family Medicine

## 2016-02-23 ENCOUNTER — Ambulatory Visit (INDEPENDENT_AMBULATORY_CARE_PROVIDER_SITE_OTHER): Payer: PRIVATE HEALTH INSURANCE | Admitting: Family Medicine

## 2016-02-23 VITALS — BP 110/68 | HR 53 | Temp 98.0°F | Ht 67.0 in | Wt 148.0 lb

## 2016-02-23 DIAGNOSIS — H60399 Other infective otitis externa, unspecified ear: Secondary | ICD-10-CM | POA: Insufficient documentation

## 2016-02-23 DIAGNOSIS — H60393 Other infective otitis externa, bilateral: Secondary | ICD-10-CM

## 2016-02-23 MED ORDER — CIPROFLOXACIN-DEXAMETHASONE 0.3-0.1 % OT SUSP: 4.0000 [drp] | Freq: Two times a day (BID) | OTIC | Status: DC

## 2016-02-23 NOTE — Patient Instructions (Signed)

## 2016-02-23 NOTE — Assessment & Plan Note (Signed)
Exam most consistent with otitis externa, though does not have discomfort on pulling of either external ear. We will proceed with treatment of otitis externa with Ciprodex drops. We'll continue to monitor. He is given return precautions.

## 2016-02-23 NOTE — Progress Notes (Signed)
Patient ID: Andrew Costaobert C Matsen, male   DOB: 09/19/1976, 40 y.o.   MRN: [id number]  Marikay AlarEric Deedee Lybarger, MD Phone: [phone]  Andrew CostaRobert C Dobbins is a 40 y.o. male who presents today for same-day visit.  Patient notes left ear discomfort. Started a couple days ago. Notes he has chronic intermittent drainage that is clear and whitish from his left ear. Occasionally notes some drainage from his right ear as well. He notes congestion in his nose and sinuses persistently. Has postnasal drip as well. Does not take any medications for any of these issues. He has been evaluated by ENT previously and was given some eardrops that did help resolve this issue though it has returned. No fevers.  PMH: Former smoker*   ROS see history of present illness  Objective  Physical Exam Filed Vitals:   02/23/16 1619  BP: 110/68  Pulse: 53  Temp: 98 F (36.7 C)    BP Readings from Last 3 Encounters:  02/23/16 110/68  07/12/14 121/72  10/26/12 120/78   Wt Readings from Last 3 Encounters:  02/23/16 148 lb (67.132 kg)  07/12/14 139 lb (63.05 kg)  10/26/12 143 lb 12 oz (65.205 kg)    Physical Exam  Constitutional: He is well-developed, well-nourished, and in no distress.  HENT:  Head: Normocephalic and atraumatic.  Mouth/Throat: Oropharynx is clear and moist. No oropharyngeal exudate.  Left ear canal with clearish whitish drainage and mild swelling, normal left TM, right ear canal with clearish whitish drainage, mild swelling, difficult to appreciate the TM given the drainage, no pain in either ear with pulling of the external ear  Eyes: Conjunctivae are normal. Pupils are equal, round, and reactive to light.  Neck: Neck supple.  Cardiovascular: Normal rate, regular rhythm and normal heart sounds.   Pulmonary/Chest: Effort normal and breath sounds normal.  Lymphadenopathy:    He has no cervical adenopathy.  Neurological: He is alert. Gait normal.  Skin: Skin is warm and dry. He is not diaphoretic.      Assessment/Plan: Please see individual problem list.  Otitis, externa, infective Exam most consistent with otitis externa, though does not have discomfort on pulling of either external ear. We will proceed with treatment of otitis externa with Ciprodex drops. We'll continue to monitor. He is given return precautions.    No orders of the defined types were placed in this encounter.    Meds ordered this encounter  Medications  . ciprofloxacin-dexamethasone (CIPRODEX) otic suspension    Sig: Place 4 drops into both ears 2 (two) times daily. For 7 days.    Dispense:  7.5 mL    Refill:  0    Marikay AlarEric Vallorie Niccoli, MD [facility]

## 2017-02-25 ENCOUNTER — Encounter: Payer: Self-pay | Admitting: Internal Medicine

## 2017-02-25 ENCOUNTER — Ambulatory Visit (INDEPENDENT_AMBULATORY_CARE_PROVIDER_SITE_OTHER): Payer: PRIVATE HEALTH INSURANCE | Admitting: Internal Medicine

## 2017-02-25 VITALS — BP 106/62 | HR 81 | Temp 97.9°F | Wt 157.5 lb

## 2017-02-25 DIAGNOSIS — B354 Tinea corporis: Secondary | ICD-10-CM | POA: Diagnosis not present

## 2017-02-25 MED ORDER — CLOTRIMAZOLE-BETAMETHASONE 1-0.05 % EX CREA: 1.0000 "application " | TOPICAL_CREAM | Freq: Two times a day (BID) | CUTANEOUS | 0 refills | Status: DC

## 2017-02-25 NOTE — Progress Notes (Signed)
   Subjective:    Patient ID: Andrew Mcdonald, male    DOB: 09/29/1976, 41 y.o.   MRN: [id number]  HPI  Pt presents to the clinic today with c/o a rash on his right thigh. He noticed this 6 weeks ago. The rash has gotten bigger in size. It has spread to the other side of his leg. It is a little itchy. He does recall recently changing laundry detergents but is not sure if this is related. He has tried Hydrocortisone cream with some relief.  Review of Systems      Past Medical History:  Diagnosis Date  . Anxiety and depression   . Inguinal hernia    right  . Unspecified personal history presenting hazards to health     Current Outpatient Prescriptions  Medication Sig Dispense Refill  . ALPRAZolam (XANAX) 0.25 MG tablet Take 0.25 mg by mouth 4 (four) times daily as needed.      No current facility-administered medications for this visit.     No Known Allergies  Family History  Problem Relation Age of Onset  . Alcohol abuse Unknown        family history  . Arthritis Unknown        family history  . Prostate cancer Unknown        1st degree relative <50    Social History   Social History  . Marital status: Married    Spouse name: N/A  . Number of children: N/A  . Years of education: N/A   Occupational History  . [employer]    Social History Main Topics  . Smoking status: Former Smoker    Packs/day: 0.50    Years: 15.00    Types: Cigarettes  . Smokeless tobacco: Never Used  . Alcohol use Yes     Comment: occ beer  . Drug use: No  . Sexual activity: Not on file   Other Topics Concern  . Not on file   Social History Narrative   Body shop, [place]      Married      Regular exercise: yes     Constitutional: Denies fever, malaise, fatigue, headache or abrupt weight changes.  Skin: Pt reports rash. Denies lesions or ulcercations.    No other specific complaints in a complete review of systems (except as listed in HPI above).  Objective:   Physical  Exam  BP 106/62   Pulse 81   Temp 97.9 F (36.6 C) (Oral)   Wt 157 lb 8 oz (71.4 kg)   SpO2 97%   BMI 24.67 kg/m  Wt Readings from Last 3 Encounters:  02/25/17 157 lb 8 oz (71.4 kg)  02/23/16 148 lb (67.1 kg)  07/12/14 139 lb (63 kg)    General: Appears his stated age,  in NAD. Skin: 1 cm round scaly lesion with central clearing noted on right lateral thigh. 3 cm round petechial lesion with central clearing noted on right medial thigh.      Assessment & Plan:   Ringworm of Body:  eRx for Lotrisone cream BID x 4-6 weeks Wash hands after applying cream or touching the lesions  Return precautions discussed Nicki ReaperBAITY, Masson Nalepa, NP

## 2017-02-25 NOTE — Patient Instructions (Signed)
Body Ringworm Body ringworm is an infection of the skin that often causes a ring-shaped rash. Body ringworm can affect any part of your skin. It can spread easily to others. Body ringworm is also called tinea corporis. What are the causes? This condition is caused by funguses called dermatophytes. The condition develops when these funguses grow out of control on the skin. You can get this condition if you touch a person or animal that has it. You can also get it if you share clothing, bedding, towels, or any other object with an infected person or pet. What increases the risk? This condition is more likely to develop in:  Athletes who often make skin-to-skin contact with other athletes, such as wrestlers.  People who share equipment and mats.  People with a weakened immune system. What are the signs or symptoms? Symptoms of this condition include:  Itchy, raised red spots and bumps.  Red scaly patches.  A ring-shaped rash. The rash may have:  A clear center.  Scales or red bumps at its center.  Redness near its borders.  Dry and scaly skin on or around it. How is this diagnosed? This condition can usually be diagnosed with a skin exam. A skin scraping may be taken from the affected area and examined under a microscope to see if the fungus is present. How is this treated? This condition may be treated with:  An antifungal cream or ointment.  An antifungal shampoo.  Antifungal medicines. These may be prescribed if your ringworm is severe, keeps coming back, or lasts a long time. Follow these instructions at home:  Take over-the-counter and prescription medicines only as told by your health care provider.  If you were given an antifungal cream or ointment:  Use it as told by your health care provider.  Wash the infected area and dry it completely before applying the cream or ointment.  If you were given an antifungal shampoo:  Use it as told by your health care  provider.  Leave the shampoo on your body for 3-5 minutes before rinsing.  While you have a rash:  Wear loose clothing to stop clothes from rubbing and irritating it.  Wash or change your bed sheets every night.  If your pet has the same infection, take your pet to see a veterinarian. How is this prevented?  Practice good hygiene.  Wear sandals or shoes in public places and showers.  Do not share personal items with others.  Avoid touching red patches of skin on other people.  Avoid touching pets that have bald spots.  If you touch an animal that has a bald spot, wash your hands. Contact a health care provider if:  Your rash continues to spread after 7 days of treatment.  Your rash is not gone in 4 weeks.  The area around your rash gets red, warm, tender, and swollen. This information is not intended to replace advice given to you by your health care provider. Make sure you discuss any questions you have with your health care provider. Document Released: 09/27/2000 Document Revised: 03/07/2016 Document Reviewed: 07/27/2015 Elsevier Interactive Patient Education  2017 [employer] Inc[employer]  

## 2018-05-29 ENCOUNTER — Ambulatory Visit (INDEPENDENT_AMBULATORY_CARE_PROVIDER_SITE_OTHER): Payer: PRIVATE HEALTH INSURANCE | Admitting: Primary Care

## 2018-05-29 ENCOUNTER — Other Ambulatory Visit: Payer: Self-pay | Admitting: Primary Care

## 2018-05-29 VITALS — BP 116/70 | HR 56 | Temp 98.1°F | Wt 140.0 lb

## 2018-05-29 DIAGNOSIS — H60503 Unspecified acute noninfective otitis externa, bilateral: Secondary | ICD-10-CM

## 2018-05-29 DIAGNOSIS — H6121 Impacted cerumen, right ear: Secondary | ICD-10-CM | POA: Diagnosis not present

## 2018-05-29 MED ORDER — CIPROFLOXACIN-DEXAMETHASONE 0.3-0.1 % OT SUSP: 4.0000 [drp] | Freq: Two times a day (BID) | OTIC | 0 refills | Status: DC

## 2018-05-29 NOTE — Patient Instructions (Addendum)
Use the Ciprodex antibiotic drops for infection. Instill 4 drops into both ears twice daily for 7 days.  For wax buildup you can use Debrox drops. These can be purchased over the counter.  It was a pleasure meeting you!

## 2018-05-29 NOTE — Progress Notes (Signed)
Subjective:    Patient ID: Ivor Costaobert C Bollen, male    DOB: 10/25/1975, 42 y.o.   MRN: [id number]  HPI  Mr. Deloria LairHamilton is a 42 year old male with a history of otitis externa who presents today with otalgia.  Located to bilateral ears with pressure. He also reports coryza, watery eyes, headaches, nasal congestion. His symptoms began 3-4 days ago. He's taken Ibuprofen for his headache with improvement.   He denies cough, fevers, chest congestion. He has been swimming frequently in both the pool and ocean.   He has an old bottle of Ciprodex with him today, states this is the "only thing that knocks this out".   Review of Systems  Constitutional: Negative for fever.  HENT: Positive for congestion, ear pain and rhinorrhea. Negative for sinus pressure and sore throat.        Ear fullness  Respiratory: Negative for shortness of breath.   Cardiovascular: Negative for chest pain.  Allergic/Immunologic: Positive for environmental allergies.  Neurological: Positive for headaches.       Past Medical History:  Diagnosis Date  . Anxiety and depression   . Inguinal hernia    right  . Unspecified personal history presenting hazards to health      Social History   Socioeconomic History  . Marital status: Married    Spouse name: Not on file  . Number of children: Not on file  . Years of education: Not on file  . Highest education level: Not on file  Occupational History  . 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 Financial resource strain: Not on file  . Food insecurity:    Worry: Not on file    Inability: Not on file  . Transportation needs:    Medical: Not on file    Non-medical: Not on file  Tobacco Use  . Smoking status: Former Smoker    Packs/day: 0.50    Years: 15.00    Pack years: 7.50    Types: Cigarettes  . Smokeless tobacco: Never Used  Substance and Sexual Activity  . Alcohol use: Yes    Comment: occ beer  . Drug use: No  . Sexual activity: Not on file  Lifestyle    . Physical activity:    Days per week: Not on file    Minutes per session: Not on file  . Stress: Not on file  Relationships  . Social connections:    Talks on phone: Not on file    Gets together: Not on file    Attends religious service: Not on file    Active member of club or organization: Not on file    Attends meetings of clubs or organizations: Not on file    Relationship status: Not on file  . Intimate partner violence:    Fear of current or ex partner: Not on file    Emotionally abused: Not on file    Physically abused: Not on file    Forced sexual activity: Not on file  Other Topics Concern  . Not on file  Social History Narrative   Body shop, [place]      Married      Regular exercise: yes    No past surgical history on file.  Family History  Problem Relation Age of Onset  . Alcohol abuse Unknown        family history  . Arthritis Unknown        family history  . Prostate cancer Unknown  1st degree relative <50    No Known Allergies  Current Outpatient Medications on File Prior to Visit  Medication Sig Dispense Refill  . ALPRAZolam (XANAX) 0.25 MG tablet Take 0.25 mg by mouth 4 (four) times daily as needed.      No current facility-administered medications on file prior to visit.     BP 116/70   Pulse (!) 56   Temp 98.1 F (36.7 C) (Oral)   Wt 140 lb (63.5 kg)   SpO2 97%   BMI 21.93 kg/m    Objective:   Physical Exam  Constitutional: He appears well-nourished. He does not appear ill.  HENT:  Nose: No mucosal edema. Right sinus exhibits no maxillary sinus tenderness and no frontal sinus tenderness. Left sinus exhibits no maxillary sinus tenderness and no frontal sinus tenderness.  Mouth/Throat: Oropharynx is clear and moist.  Erythema to bilateral canals, mild swelling. Cerumen impaction to right canal, TM unremarkable post irriation.  Neck: Neck supple.  Cardiovascular: Normal rate and regular rhythm.  Respiratory: Effort normal and  breath sounds normal. He has no wheezes.  Skin: Skin is warm and dry.           Assessment & Plan:  Acute Otitis Externa:  Located to bilateral canals. Discussed to avoid swimming until symptoms resolve. Rx for Ciprodex drops sent to pharmacy. Follow up PRN.  Doreene NestKatherine K Fahima Cifelli, NP

## 2018-06-01 ENCOUNTER — Telehealth: Payer: Self-pay | Admitting: *Deleted

## 2018-06-01 ENCOUNTER — Telehealth: Payer: Self-pay

## 2018-06-01 MED ORDER — OFLOXACIN 0.3 % OT SOLN: 10.0000 [drp] | Freq: Every day | OTIC | 0 refills | Status: DC

## 2018-06-01 NOTE — Telephone Encounter (Signed)
Spoke with patient earlier today. We do not have any samples available at our office.  Dr. Patsy Lageropland did send in a new prescription for ofloxacin ear drops.

## 2018-06-01 NOTE — Telephone Encounter (Signed)
Mr. Andrew Mcdonald notified that Dr. Patsy Lageropland has sent him in [facility] [place].

## 2018-06-01 NOTE — Telephone Encounter (Signed)
Copied from CRM [id number]. Topic: General - Other >> Jun 01, 2018  8:11 AM Oneal GroutSebastian, Jennifer S wrote: Reason for CRM: Patient seen on Friday, prescribed ciprofloxacin-dexamethasone (CIPRODEX) OTIC suspension, cost over $200, can something else be called in? >> Jun 01, 2018  8:51 AM Tamela OddiMartin, Don'Quashia, NT wrote: Patient called again and states that he wants to know if the provider can give him a sample of something he can take . Until he hears back form the provider about the original medication   CB# [phone]

## 2018-06-01 NOTE — Telephone Encounter (Signed)
Pt was given Ciprodex otic suspension on 05/29/18.[facility] [place]. Allayne GitelmanK Clark NP out of office until 06/08/18.Please advise.

## 2018-06-01 NOTE — Telephone Encounter (Signed)
PLEASE NOTE: All timestamps contained within this report are represented as [place] Standard Time. CONFIDENTIALTY NOTICE: This fax transmission is intended only for the addressee. It contains information that is legally privileged, confidential or otherwise protected from use or disclosure. If you are not the intended recipient, you are strictly prohibited from reviewing, disclosing, copying using or disseminating any of this information or taking any action in reliance on or regarding this information. If you have received this fax in error, please notify us immediately by telephone so that we can arrange for its return to us. Phone: [phone], Toll-Free: [phone], Fax: [phone] Page: 1 of 1 Call Id: [id number] [facility] - Client TELEPHONE ADVICE RECORD [facility] Patient Name: Andrew KochROBERT Shrader Gender: Male DOB: 08/06/1976 Age: [age] Y 4 M 12 D Return Phone Number: [phone] (Primary) Address: City[place]State[place]: [place] [id number] Client [place] - Client [facility] - Night Physician Vernona Riegerlark, Katherine - NP Contact Type Call Who Is Calling Patient / Member / Family / Caregiver Call Type Triage / Clinical Caller Name Morrie Sheldonshley Relationship To Patient Spouse Return Phone Number [phone] (Primary) Chief Complaint Prescription Refill or Medication Request (non symptomatic) Reason for Call Medication Question / Request Initial Comment Spouse was seen today and Rx was too expensive. Wants less expensive Rx called in. Translation No Nurse Assessment Guidelines Guideline Title Affirmed Question Affirmed Notes Nurse Date/Time (Eastern Time) Disp. Time Lamount Cohen(Eastern Time) Disposition Final User 05/29/2018 10:27:23 PM Attempt made - message left Hipps, RN, Loistine Chancehilip 05/29/2018 10:41:03 PM FINAL ATTEMPT MADE - message left Hipps, RN, Loistine Chancehilip 05/29/2018 10:41:11 PM Clinical Call  Yes Hipps, RN, Loistine ChancePhilip

## 2018-06-01 NOTE — Telephone Encounter (Signed)
PLEASE NOTE: All timestamps contained within this report are represented as [place] Standard Time. CONFIDENTIALTY NOTICE: This fax transmission is intended only for the addressee. It contains information that is legally privileged, confidential or otherwise protected from use or disclosure. If you are not the intended recipient, you are strictly prohibited from reviewing, disclosing, copying using or disseminating any of this information or taking any action in reliance on or regarding this information. If you have received this fax in error, please notify us immediately by telephone so that we can arrange for its return to us. Phone: [phone], Toll-Free: [phone], Fax: [phone] Page: 1 of 1 Call Id: [id number] [place] [facility] - Client TELEPHONE ADVICE RECORD [facility] Patient Name: Andrew Mcdonald Gender: Male DOB: 06/29/1976 Age: [age] Y 4 M 12 D Return Phone Number: [phone] (Primary), [phone] (Secondary) Address: City/State/[place]: [place] [address] Client [facility] [place] [facility] - Night Physician Copland, Karleen HampshireSpencer - MD Contact Type Call Who Is Calling Patient / Member / Family / Caregiver Call Type Triage / Clinical Relationship To Patient Self Return Phone Number [phone] (Primary) Chief Complaint Earache Reason for Call Symptomatic / Request for Health Information Initial Comment Caller states he was seen yesterday for an ear infection. His RX cost too much. He needs some new called in. Translation No Nurse Assessment Nurse: [name], RN, [name] Date/Time Lamount Cohen(Eastern Time): 05/30/2018 9:14:53 AM Confirm and document reason for call. If symptomatic, describe symptoms. ---Caller states that he was seen 05/29/18 for an ear infection Prescription for ear drops cost 261.00 dollars. Needs something cheaper Does the patient have any new or worsening  symptoms? ---No Guidelines Guideline Title Affirmed Question Affirmed Notes Nurse Date/Time (Eastern Time) Disp. Time Lamount Cohen(Eastern Time) Disposition Final User 05/30/2018 9:22:06 AM Clinical Call Yes [name], RN, [name] Comments User: Criss Rosaleseresa, Irvin, RN Date/Time Lamount Cohen(Eastern Time): 05/30/2018 9:21:57 AM Notified caller no MD on call.Advised of [facility] being open. Caller will get prescription transferred if possible

## 2018-06-01 NOTE — Telephone Encounter (Signed)
floxin otic generic, 10 drops to affected ear once a day x 7 days, 1 bottle

## 2020-07-26 ENCOUNTER — Other Ambulatory Visit: Payer: Self-pay

## 2020-07-26 ENCOUNTER — Encounter: Payer: Self-pay | Admitting: Family Medicine

## 2020-07-26 ENCOUNTER — Ambulatory Visit (INDEPENDENT_AMBULATORY_CARE_PROVIDER_SITE_OTHER): Payer: PRIVATE HEALTH INSURANCE | Admitting: Family Medicine

## 2020-07-26 VITALS — BP 108/70 | HR 102 | Temp 98.3°F | Wt 133.2 lb

## 2020-07-26 DIAGNOSIS — H60392 Other infective otitis externa, left ear: Secondary | ICD-10-CM

## 2020-07-26 MED ORDER — CIPROFLOXACIN-DEXAMETHASONE 0.3-0.1 % OT SUSP: 2.0000 [drp] | OTIC | 0 refills | Status: DC | PRN

## 2020-07-26 NOTE — Progress Notes (Signed)
   Subjective:     Andrew Mcdonald is a 44 y.o. male presenting for Ear Fullness (x 1 week / hx of this over last 10 years   )     HPI  #Ear fullness - hx of otitis externa - improves with ciprodex - feels like this is coming on - saw ENT and was told to use ear plugs w/o success -   Review of Systems   Social History   Tobacco Use  Smoking Status Former Smoker  . Packs/day: 0.50  . Years: 15.00  . Pack years: 7.50  . Types: Cigarettes  Smokeless Tobacco Never Used        Objective:    BP Readings from Last 3 Encounters:  07/26/20 108/70  05/29/18 116/70  02/25/17 106/62   Wt Readings from Last 3 Encounters:  07/26/20 133 lb 4 oz (60.4 kg)  05/29/18 140 lb (63.5 kg)  02/25/17 157 lb 8 oz (71.4 kg)    BP 108/70   Pulse (!) 102   Temp 98.3 F (36.8 C) (Temporal)   Wt 133 lb 4 oz (60.4 kg)   SpO2 96%   BMI 20.87 kg/m    Physical Exam Constitutional:      Appearance: Normal appearance. He is not ill-appearing or diaphoretic.  HENT:     Right Ear: Ear canal and external ear normal. There is impacted cerumen.     Left Ear: Tympanic membrane and external ear normal.     Ears:     Comments: Left canal with erythema and purulent drainage    Nose: Nose normal.  Eyes:     General: No scleral icterus.    Extraocular Movements: Extraocular movements intact.     Conjunctiva/sclera: Conjunctivae normal.  Cardiovascular:     Rate and Rhythm: Normal rate and regular rhythm.     Heart sounds: No murmur heard.   Pulmonary:     Effort: Pulmonary effort is normal. No respiratory distress.     Breath sounds: Normal breath sounds. No wheezing.  Musculoskeletal:     Cervical back: Neck supple.  Skin:    General: Skin is warm and dry.  Neurological:     Mental Status: He is alert. Mental status is at baseline.  Psychiatric:        Mood and Affect: Mood normal.           Assessment & Plan:   Problem List Items Addressed This Visit    None      Visit Diagnoses    Other infective acute otitis externa of left ear    -  Primary   Relevant Medications   ciprofloxacin-dexamethasone (CIPRODEX) OTIC suspension     Hx of recurrent infections. But has been several months since last. Consistent with external ear infection. Per patient report responds best to Ciprodex. He saw ENT and felt it was not helpful. Return precautions and GoodRx coupon printed today.    Return if symptoms worsen or fail to improve.  Lynnda Child, MD  This visit occurred during the SARS-CoV-2 public health emergency.  Safety protocols were in place, including screening questions prior to the visit, additional usage of staff PPE, and extensive cleaning of exam room while observing appropriate contact time as indicated for disinfecting solutions.

## 2020-07-26 NOTE — Patient Instructions (Addendum)
Would recommend checking out other treatments with your insurance  Good Rx coupon Sent to [facility] in [place]  Call if coupon doesn't work and too expensive

## 2021-02-14 ENCOUNTER — Ambulatory Visit (INDEPENDENT_AMBULATORY_CARE_PROVIDER_SITE_OTHER): Payer: Self-pay | Admitting: Family Medicine

## 2021-02-14 ENCOUNTER — Encounter: Payer: Self-pay | Admitting: Family Medicine

## 2021-02-14 ENCOUNTER — Other Ambulatory Visit: Payer: Self-pay

## 2021-02-14 VITALS — BP 120/76 | HR 83 | Temp 98.1°F | Ht 67.0 in | Wt 150.0 lb

## 2021-02-14 DIAGNOSIS — H6121 Impacted cerumen, right ear: Secondary | ICD-10-CM

## 2021-02-14 DIAGNOSIS — H9201 Otalgia, right ear: Secondary | ICD-10-CM | POA: Insufficient documentation

## 2021-02-14 NOTE — Assessment & Plan Note (Signed)
Given some pain and pt history of frequent OE. Encourage pt to not use qtips, and treat with topical antibiotics x 1-2 days.

## 2021-02-14 NOTE — Assessment & Plan Note (Signed)
Improved symptoms after simple ear irrigation without complications.

## 2021-02-14 NOTE — Progress Notes (Signed)
Patient ID: Andrew Mcdonald, male    DOB: 1976/02/21, 45 y.o.   MRN: [id number]  This visit was conducted in person.  BP 120/76 (BP Location: Left Arm, Patient Position: Sitting, Cuff Size: Normal)   Pulse 83   Temp 98.1 F (36.7 C) (Temporal)   Ht 5\' 7"  (1.702 m)   Wt 150 lb (68 kg)   SpO2 97%   BMI 23.49 kg/m    CC:  Chief Complaint  Patient presents with  . Acute Visit    Possible ear infection/right ear fullness x 3 days; slight pain and starting to move to the left ear now.    Subjective:   HPI: Andrew Mcdonald is a 45 y.o. male presenting on 02/14/2021 for Acute Visit (Possible ear infection/right ear fullness x 3 days; slight pain and starting to move to the left ear now.)  He report 3 days ago he noted clogged feeling, fullness in right ear. Now with mild pain in right off and on.   Mild congestion. Occ sneezing, itchy eyes. He has been using ear drops that were leftover.. cipro and dexamethasone.  He has not been able to get the drops into his ear given blockage.    Has been treat in past repeatedly for  External otitis.  Reviewed last OV for similar issue in 07/26/2020     Relevant past medical, surgical, family and social history reviewed and updated as indicated. Interim medical history since our last visit reviewed. Allergies and medications reviewed and updated. Outpatient Medications Prior to Visit  Medication Sig Dispense Refill  . ALPRAZolam (XANAX) 1 MG tablet SMARTSIG:1 By Mouth 4 Times Daily PRN    . amphetamine-dextroamphetamine (ADDERALL) 20 MG tablet Take by mouth as needed.    . sertraline (ZOLOFT) 100 MG tablet Take 2 tablets by mouth daily.    07/28/2020 ALPRAZolam (XANAX) 0.25 MG tablet Take 0.25 mg by mouth 4 (four) times daily as needed.    . ciprofloxacin-dexamethasone (CIPRODEX) OTIC suspension Place 2 drops into both ears as needed. 7.5 mL 0   No facility-administered medications prior to visit.     Per HPI unless specifically indicated in  ROS section below Review of Systems  Constitutional: Negative for fatigue and fever.  HENT: Negative for ear pain.   Eyes: Negative for pain.  Respiratory: Negative for cough and shortness of breath.   Cardiovascular: Negative for chest pain, palpitations and leg swelling.  Gastrointestinal: Negative for abdominal pain.  Genitourinary: Negative for dysuria.  Musculoskeletal: Negative for arthralgias.  Neurological: Negative for syncope, light-headedness and headaches.  Psychiatric/Behavioral: Negative for dysphoric mood.   Objective:  BP 120/76 (BP Location: Left Arm, Patient Position: Sitting, Cuff Size: Normal)   Pulse 83   Temp 98.1 F (36.7 C) (Temporal)   Ht 5\' 7"  (1.702 m)   Wt 150 lb (68 kg)   SpO2 97%   BMI 23.49 kg/m   Wt Readings from Last 3 Encounters:  02/14/21 150 lb (68 kg)  07/26/20 133 lb 4 oz (60.4 kg)  05/29/18 140 lb (63.5 kg)      Physical Exam Constitutional:      Appearance: He is well-developed.  HENT:     Head: Normocephalic.     Comments: Narrow ear canals    Right Ear: Hearing normal. There is impacted cerumen. Tympanic membrane is not injected or erythematous. Tympanic membrane has normal mobility.     Left Ear: Hearing and ear canal normal.  No middle ear effusion. There  is no impacted cerumen. Tympanic membrane is not injected. Tympanic membrane has normal mobility.     Nose: Nose normal.  Neck:     Thyroid: No thyroid mass or thyromegaly.     Vascular: No carotid bruit.     Trachea: Trachea normal.  Cardiovascular:     Rate and Rhythm: Normal rate and regular rhythm.     Pulses: Normal pulses.     Heart sounds: Heart sounds not distant. No murmur heard. No friction rub. No gallop.      Comments: No peripheral edema Pulmonary:     Effort: Pulmonary effort is normal. No respiratory distress.     Breath sounds: Normal breath sounds.  Skin:    General: Skin is warm and dry.     Findings: No rash.  Psychiatric:        Speech: Speech  normal.        Behavior: Behavior normal.        Thought Content: Thought content normal.       Results for orders placed or performed in visit on 05/11/12  TB Skin Test  Result Value Ref Range   TB Skin Test Negative    Induration 0 mm    This visit occurred during the SARS-CoV-2 public health emergency.  Safety protocols were in place, including screening questions prior to the visit, additional usage of staff PPE, and extensive cleaning of exam room while observing appropriate contact time as indicated for disinfecting solutions.   COVID 19 screen:  No recent travel or known exposure to COVID19 The patient denies respiratory symptoms of COVID 19 at this time. The importance of social distancing was discussed today.   Assessment and Plan Problem List Items Addressed This Visit    Right ear impacted cerumen - Primary    Improved symptoms after simple ear irrigation without complications.      Right ear pain    Given some pain and pt history of frequent OE. Encourage pt to not use qtips, and treat with topical antibiotics x 1-2 days.            Kerby Nora, MD

## 2021-09-20 ENCOUNTER — Ambulatory Visit: Payer: Self-pay | Admitting: Family Medicine

## 2021-09-21 ENCOUNTER — Encounter: Payer: Self-pay | Admitting: Family

## 2021-09-21 ENCOUNTER — Ambulatory Visit: Payer: Self-pay | Admitting: Family

## 2021-09-21 ENCOUNTER — Other Ambulatory Visit: Payer: Self-pay

## 2021-09-21 ENCOUNTER — Ambulatory Visit (INDEPENDENT_AMBULATORY_CARE_PROVIDER_SITE_OTHER): Payer: Self-pay | Admitting: Family

## 2021-09-21 VITALS — BP 112/80 | HR 58 | Temp 97.9°F | Ht 67.0 in | Wt 164.0 lb

## 2021-09-21 DIAGNOSIS — H60312 Diffuse otitis externa, left ear: Secondary | ICD-10-CM

## 2021-09-21 DIAGNOSIS — H6121 Impacted cerumen, right ear: Secondary | ICD-10-CM

## 2021-09-21 MED ORDER — CORTISPORIN-TC 3.3-3-10-0.5 MG/ML OT SUSP: 4.0000 [drp] | Freq: Four times a day (QID) | OTIC | 0 refills | Status: AC

## 2021-09-21 NOTE — Assessment & Plan Note (Signed)
Eardrops prescribed and sent to the pharmacy.  Handout given to patient.

## 2021-09-21 NOTE — Patient Instructions (Signed)
It was a pleasure seeing you today! Please do not hesitate to reach out with any questions and or concerns. ° °Regards,  ° °Manolo Bosket °FNP-C ° °

## 2021-09-21 NOTE — Progress Notes (Signed)
Established Patient Office Visit  Subjective:  Patient ID: Andrew Mcdonald, male    DOB: [birth year]  Age: 45 y.o. MRN: [id number]  CC:  Chief Complaint  Patient presents with   Ear Pain    Left     HPI MERIC JOYE is here today with c/o left ear pain with some discharge/clear maybe a bit yellow. Right ear feeling stopped up. No sore throat. Sneezing a lot, not sinus pressure. No cough. No fever and or chills.  Sx have been for the last four days.   Not taking anything otc at the moment, in the past when this occurs it is impaction of his ear canal.   Past Medical History:  Diagnosis Date   Anxiety and depression    Inguinal hernia    right   Unspecified personal history presenting hazards to health     History reviewed. No pertinent surgical history.  Family History  Problem Relation Age of Onset   Alcohol abuse Other        family history   Arthritis Other        family history   Prostate cancer Other        1st degree relative <50    Social History   Socioeconomic History   Marital status: Married    Spouse name: Not on file   Number of children: Not on file   Years of education: Not on file   Highest education level: Not on file  Occupational History   Occupation: Body shop  Tobacco Use   Smoking status: Former    Packs/day: 0.50    Years: 15.00    Pack years: 7.50    Types: Cigarettes   Smokeless tobacco: Never  Substance and Sexual Activity   Alcohol use: Yes    Comment: occ beer   Drug use: No   Sexual activity: Not on file  Other Topics Concern   Not on file  Social History Narrative   Body shop, [place]      Married      Regular exercise: yes   Social Determinants of Health   Financial Resource Strain: Not on file  Food Insecurity: Not on file  Transportation Needs: Not on file  Physical Activity: Not on file  Stress: Not on file  Social Connections: Not on file  Intimate Partner Violence: Not on file    Outpatient  Medications Prior to Visit  Medication Sig Dispense Refill   ALPRAZolam (XANAX) 1 MG tablet SMARTSIG:1 By Mouth 4 Times Daily PRN     amphetamine-dextroamphetamine (ADDERALL) 20 MG tablet Take by mouth as needed.     sertraline (ZOLOFT) 100 MG tablet Take 2 tablets by mouth daily.     No facility-administered medications prior to visit.    No Known Allergies  ROS Review of Systems  Constitutional:  Negative for chills and fever.  HENT:  Positive for ear pain (right ear pain with left ear fullness). Negative for congestion, sinus pain and sore throat.   Respiratory:  Negative for cough, shortness of breath and wheezing.   Cardiovascular:  Negative for chest pain and palpitations.     Objective:    Physical Exam Constitutional:      General: He is not in acute distress.    Appearance: Normal appearance. He is normal weight. He is not ill-appearing, toxic-appearing or diaphoretic.  HENT:     Head: Normocephalic.     Right Ear: Hearing normal. Swelling (in ear cana, minimal) present.  No tenderness. There is impacted cerumen.     Left Ear: Hearing normal. Drainage (yellow wet discharge), swelling and tenderness present. Tympanic membrane is not bulging.     Nose: Nose normal.     Mouth/Throat:     Mouth: Mucous membranes are moist.  Neurological:     Mental Status: He is alert.    BP 112/80   Pulse (!) 58   Temp 97.9 F (36.6 C) (Temporal)   Ht 5' 7" (1.702 m)   Wt 164 lb (74.4 kg)   SpO2 96%   BMI 25.69 kg/m  Wt Readings from Last 3 Encounters:  09/21/21 164 lb (74.4 kg)  02/14/21 150 lb (68 kg)  07/26/20 133 lb 4 oz (60.4 kg)     Health Maintenance Due  Topic Date Due   COVID-19 Vaccine (1) Never done   HIV Screening  Never done   Hepatitis C Screening  Never done   INFLUENZA VACCINE  05/14/2021   TETANUS/TDAP  08/11/2021    There are no preventive care reminders to display for this patient.  No results found for: TSH No results found for: WBC, HGB, HCT,  MCV, PLT No results found for: NA, K, CHLORIDE, CO2, GLUCOSE, BUN, CREATININE, BILITOT, ALKPHOS, AST, ALT, PROT, ALBUMIN, CALCIUM, ANIONGAP, EGFR, GFR No results found for: HGBA1C    Assessment & Plan:   Problem List Items Addressed This Visit       Nervous and Auditory   Acute diffuse otitis externa of left ear - Primary    Eardrops prescribed and sent to the pharmacy.  Handout given to patient.      Relevant Medications   neomycin-colistin-hydrocortisone-thonzonium (CORTISPORIN-TC) 3.12-14-08-0.5 MG/ML OTIC suspension   Right ear impacted cerumen    Ceruminosis is noted.  Wax is removed by syringing and manual debridement with a currette. Instructions for home care to prevent wax buildup are given.        Meds ordered this encounter  Medications   neomycin-colistin-hydrocortisone-thonzonium (CORTISPORIN-TC) 3.12-14-08-0.5 MG/ML OTIC suspension    Sig: Place 4 drops into the left ear 4 (four) times daily for 10 days.    Dispense:  10 mL    Refill:  0    Order Specific Question:   Supervising Provider    Answer:   Diona Browner, AMY E [[id number]]    Follow-up: Return in 1 week (on 09/28/2021), or if symptoms worsen or fail to improve, for if no improvement in sx will refer to ENT.    Eugenia Pancoast, FNP

## 2021-09-21 NOTE — Assessment & Plan Note (Addendum)
Ceruminosis is noted.  Wax is removed by syringing/irrigation by medical assistant, and manual debridement with a currette. Pt tolerated procedure well. Instructions for home care to prevent wax buildup are given.  Suggested daily hydrogen peroxide/half water capful daily to bil ears once OE has resolved for maintenance rather than use of Qtips as pt with very narrow canals and irritation to the canal from Qtip use. Also suggested ENT f/u if no improvement and or ongoing recurrence.

## 2021-11-07 ENCOUNTER — Telehealth: Payer: Self-pay | Admitting: Family Medicine

## 2021-11-07 NOTE — Telephone Encounter (Signed)
Tried calling the patient and he did not answer. LVM for patient to call back.   

## 2021-11-07 NOTE — Telephone Encounter (Signed)
Pt called stating that medication neomycin-colistin-hydrocortisone-thonzonium (CORTISPORIN-TC) 3.12-14-08-0.5 MG/ML OTIC suspension is on back order and will not have any until the end of february. Pt is asking is there anything else that can be prescribed. Please advise.

## 2021-11-08 NOTE — Telephone Encounter (Signed)
Sent patient a Clinical cytogeneticist message for him to set up an appointment with you.

## 2021-11-08 NOTE — Telephone Encounter (Signed)
Also with active mychart, please message him since he did not return call please.

## 2021-11-28 ENCOUNTER — Encounter: Payer: Self-pay | Admitting: Family Medicine

## 2021-11-28 ENCOUNTER — Other Ambulatory Visit: Payer: Self-pay

## 2021-11-28 ENCOUNTER — Ambulatory Visit (INDEPENDENT_AMBULATORY_CARE_PROVIDER_SITE_OTHER): Payer: Self-pay | Admitting: Family Medicine

## 2021-11-28 VITALS — BP 126/70 | HR 70 | Temp 98.0°F | Ht 67.0 in | Wt 167.1 lb

## 2021-11-28 DIAGNOSIS — H608X2 Other otitis externa, left ear: Secondary | ICD-10-CM

## 2021-11-28 MED ORDER — OFLOXACIN 0.3 % OT SOLN: 10.0000 [drp] | Freq: Every day | OTIC | 0 refills | Status: DC

## 2021-11-28 NOTE — Progress Notes (Signed)
° ° °  Andrew Mcdonald T. Andrew Helmkamp, MD, [place] at [facility] [place], [address]  Phone: [phone]   FAX: [phone]  Andrew Mcdonald - 46 y.o. male   MRN [id number]   Date of Birth: Feb 17, 1976  Date: 11/28/2021   PCP: Owens Loffler, MD   Referral: Owens Loffler, MD  Chief Complaint  Patient presents with   Ear Pain    C/o ongoing L ear pain and drainage.     This visit occurred during the SARS-CoV-2 public health emergency.  Safety protocols were in place, including screening questions prior to the visit, additional usage of staff PPE, and extensive cleaning of exam room while observing appropriate contact time as indicated for disinfecting solutions.   Subjective:   Andrew Mcdonald is a 46 y.o. very pleasant male patient with Body mass index is 26.17 kg/m. who presents with the following:  The patient presents with some ongoing ear pain.  I have seen him before, but is been 10 years.  09/21/2021 he did see Ms. Dugal regarding some otitis externa, and ultimately the antibiotic drops that he was given are not available due to national back order.  He tells me that he contacted our office  He has had a recurrence of some symptoms with discomfort and drainage  Review of Systems is noted in the HPI, as appropriate  Objective:   BP 126/70    Pulse 70    Temp 98 F (36.7 C) (Temporal)    Ht 5\' 7"  (1.702 m)    Wt 167 lb 1 oz (75.8 kg)    SpO2 96%    BMI 26.17 kg/m   GEN: No acute distress; alert,appropriate. PULM: Breathing comfortably in no respiratory distress PSYCH: Normally interactive.  Right TM clear.  No jaw pain or lymphadenopathy Left canal is quite swollen.  The TM does appear clear to me.  Laboratory and Imaging Data:  Assessment and Plan:     ICD-10-CM   1. Actinic otitis externa of left ear, unspecified chronicity  H60.8X2      Total encounter time: 5 minutes. This includes total time spent on the  day of encounter.    OE.  This was present before.  Medications were not available due to national back order, and it appears that nobody change these for him.  I am going to give him some Floxin otic, and showed him good Rx.  Meds ordered this encounter  Medications   ofloxacin (FLOXIN) 0.3 % OTIC solution    Sig: Place 10 drops into the left ear daily.    Dispense:  10 mL    Refill:  0   There are no discontinued medications. No orders of the defined types were placed in this encounter.   Follow-up: No follow-ups on file.  Dragon [employer] One speech-to-text software was used for transcription in this dictation.  Possible transcriptional errors can occur using Editor, commissioning.   Signed,  Maud Deed. Radha Coggins, MD   Outpatient Encounter Medications as of 11/28/2021  Medication Sig   ALPRAZolam (XANAX) 1 MG tablet SMARTSIG:1 By Mouth 4 Times Daily PRN   amphetamine-dextroamphetamine (ADDERALL) 20 MG tablet Take by mouth as needed.   ofloxacin (FLOXIN) 0.3 % OTIC solution Place 10 drops into the left ear daily.   sertraline (ZOLOFT) 100 MG tablet Take 2 tablets by mouth daily.   No facility-administered encounter medications on file as of 11/28/2021.

## 2022-12-11 ENCOUNTER — Ambulatory Visit (INDEPENDENT_AMBULATORY_CARE_PROVIDER_SITE_OTHER): Payer: Self-pay | Admitting: Family Medicine

## 2022-12-11 ENCOUNTER — Encounter: Payer: Self-pay | Admitting: Family Medicine

## 2022-12-11 VITALS — BP 90/70 | HR 80 | Temp 98.1°F | Ht 67.0 in | Wt 153.2 lb

## 2022-12-11 DIAGNOSIS — H60312 Diffuse otitis externa, left ear: Secondary | ICD-10-CM

## 2022-12-11 MED ORDER — NEOMYCIN-POLYMYXIN-HC 3.5-10000-1 OT SOLN: 4.0000 [drp] | Freq: Four times a day (QID) | OTIC | 3 refills | Status: DC

## 2022-12-11 NOTE — Progress Notes (Signed)
    Andrew Truluck T. Dammon Makarewicz, MD, [place] at [facility] [place], [address]  Phone: [phone]  FAX: [phone]  Andrew Mcdonald - 47 y.o. male  MRN [id number]  Date of Birth: Apr 14, 1976  Date: 12/11/2022  PCP: Andrew Loffler, MD  Referral: Andrew Loffler, MD  Chief Complaint  Patient presents with   Ear Pain    Left   Subjective:   Andrew Mcdonald is a 47 y.o. very pleasant male patient with Body mass index is 24 kg/m. who presents with the following:  L ear pain: He presents with left-sided ear pain and ear canal pain.  He has had this recurrently over the years, and he has seen different otolaryngologist about it, and he does have repeated otitis externa and a small ear canal.  This always responds to antibiotic drops.  Otitis externa, L   Review of Systems is noted in the HPI, as appropriate  Objective:   BP 90/70   Pulse 80   Temp 98.1 F (36.7 C) (Temporal)   Ht 5' 7"$  (1.702 m)   Wt 153 lb 4 oz (69.5 kg)   SpO2 96%   BMI 24.00 kg/m   GEN: No acute distress; alert,appropriate. PULM: Breathing comfortably in no respiratory distress PSYCH: Normally interactive.   Left TM is clear, however the canal is swollen and boggy in appearance.  He has pain with movement of the ear on the left.  Right ear canal and TM are normal  Laboratory and Imaging Data:  Assessment and Plan:     ICD-10-CM   1. Acute diffuse otitis externa of left ear  H60.312      Intermittent chronic otitis externa, and we will treat as such.  Medication Management during today's office visit: Meds ordered this encounter  Medications   neomycin-polymyxin-hydrocortisone (CORTISPORIN) OTIC solution    Sig: Place 4 drops into the left ear 4 (four) times daily.    Dispense:  10 mL    Refill:  3   Medications Discontinued During This Encounter  Medication Reason   ofloxacin (FLOXIN) 0.3 % OTIC solution     Orders placed  today for conditions managed today: No orders of the defined types were placed in this encounter.   Disposition: No follow-ups on file.  Dragon Medical One speech-to-text software was used for transcription in this dictation.  Possible transcriptional errors can occur using Editor, commissioning.   Signed,  Andrew Deed. Linnae Rasool, MD   Outpatient Encounter Medications as of 12/11/2022  Medication Sig   ALPRAZolam (XANAX) 1 MG tablet SMARTSIG:1 By Mouth 4 Times Daily PRN   amphetamine-dextroamphetamine (ADDERALL) 20 MG tablet Take by mouth as needed.   neomycin-polymyxin-hydrocortisone (CORTISPORIN) OTIC solution Place 4 drops into the left ear 4 (four) times daily.   sertraline (ZOLOFT) 100 MG tablet Take 2 tablets by mouth daily.   [DISCONTINUED] ofloxacin (FLOXIN) 0.3 % OTIC solution Place 10 drops into the left ear daily. (Patient not taking: Reported on 12/11/2022)   No facility-administered encounter medications on file as of 12/11/2022.

## 2022-12-11 NOTE — Patient Instructions (Addendum)
Go to Goodrx.com  Look up the generic Cortisporin for the coupon

## 2024-09-14 ENCOUNTER — Ambulatory Visit: Payer: Self-pay | Admitting: Family

## 2024-09-14 ENCOUNTER — Encounter: Payer: Self-pay | Admitting: Family

## 2024-09-14 VITALS — BP 116/70 | HR 66 | Temp 98.9°F | Ht 67.0 in | Wt 142.8 lb

## 2024-09-14 DIAGNOSIS — H6121 Impacted cerumen, right ear: Secondary | ICD-10-CM

## 2024-09-14 DIAGNOSIS — H6123 Impacted cerumen, bilateral: Secondary | ICD-10-CM

## 2024-09-14 DIAGNOSIS — H6122 Impacted cerumen, left ear: Secondary | ICD-10-CM

## 2024-09-14 MED ORDER — NEOMYCIN-POLYMYXIN-HC 3.5-10000-1 OT SOLN: 4.0000 [drp] | Freq: Four times a day (QID) | OTIC | 3 refills | Status: DC

## 2024-09-15 ENCOUNTER — Encounter: Payer: Self-pay | Admitting: Family

## 2024-09-15 NOTE — Progress Notes (Signed)
 Acute Office Visit  Subjective:     Patient ID: Andrew Mcdonald, male    DOB: 1976-01-10, 48 y.o.   MRN: [id number]  Chief Complaint  Patient presents with   Acute Visit    Left ear drainage x 1 week & right ear popping    HPI Patient is in today with complaints of left ear drainage and his right ear popping.  This has been ongoing x 1 week.  He has a history of otitis externa and cerumen impactions in the past.  Denies any fever, chills, no sneezing, cough, or congestion.  Has mild ear pain.  Patient also reports that he keeps a prescription for Cortisporin  as he typically gets otitis externa pretty frequently.  Review of Systems  Constitutional: Negative.   HENT:  Positive for ear discharge and ear pain.        Right ear popping, left ear drainage  Respiratory: Negative.    Cardiovascular: Negative.   All other systems reviewed and are negative.  Past Medical History:  Diagnosis Date   Anxiety and depression    Inguinal hernia    right   Unspecified personal history presenting hazards to health     Social History   Socioeconomic History   Marital status: Married    Spouse name: Not on file   Number of children: Not on file   Years of education: Not on file   Highest education level: Not on file  Occupational History   Occupation: Body [employer]  Tobacco Use   Smoking status: Former    Current packs/day: 0.50    Average packs/day: 0.5 packs/day for 15.0 years (7.5 ttl pk-yrs)    Types: Cigarettes   Smokeless tobacco: Never  Substance and Sexual Activity   Alcohol use: Yes    Comment: occ beer   Drug use: No   Sexual activity: Not on file  Other Topics Concern   Not on file  Social History Narrative   Body shop, [place]      Married      Regular exercise: yes   Social Drivers of Corporate Investment Banker Strain: Not on file  Food Insecurity: Not on file  Transportation Needs: Not on file  Physical Activity: Not on file  Stress: Not on file  Social  Connections: Not on file  Intimate Partner Violence: Not on file    History reviewed. No pertinent surgical history.  Family History  Problem Relation Age of Onset   Alcohol abuse Other        family history   Arthritis Other        family history   Prostate cancer Other        1st degree relative <50    No Known Allergies  Current Outpatient Medications on File Prior to Visit  Medication Sig Dispense Refill   ALPRAZolam (XANAX) 1 MG tablet SMARTSIG:1 By Mouth 4 Times Daily PRN     amphetamine-dextroamphetamine (ADDERALL) 20 MG tablet Take by mouth as needed.     sertraline (ZOLOFT) 100 MG tablet Take 2 tablets by mouth daily.     No current facility-administered medications on file prior to visit.    BP 116/70   Pulse 66   Temp 98.9 F (37.2 C)   Ht 5' 7 (1.702 m)   Wt 142 lb 12.8 oz (64.8 kg)   SpO2 97%   BMI 22.37 kg/m chart      Objective:    BP 116/70   Pulse  66   Temp 98.9 F (37.2 C)   Ht 5' 7 (1.702 m)   Wt 142 lb 12.8 oz (64.8 kg)   SpO2 97%   BMI 22.37 kg/m    Physical Exam Vitals and nursing note reviewed.  Constitutional:      Appearance: Normal appearance. He is normal weight.  HENT:     Right Ear: There is impacted cerumen.     Left Ear: There is impacted cerumen.     Ears:     Comments: Bilateral cerumen impaction noted.  Informed consent was obtained and peroxide gel was inserted into the ears bilaterally using the lavage kit the ears were lavaged until clean.Inspection with a cerumen spoon removed residual wax. Patient tolerated the procedure well.     Nose: Nose normal.     Mouth/Throat:     Mouth: Mucous membranes are moist.  Eyes:     Pupils: Pupils are equal, round, and reactive to light.  Cardiovascular:     Rate and Rhythm: Normal rate and regular rhythm.  Pulmonary:     Effort: Pulmonary effort is normal.     Breath sounds: Normal breath sounds.  Musculoskeletal:        General: Normal range of motion.     Cervical  back: Normal range of motion and neck supple.  Skin:    General: Skin is warm.  Neurological:     General: No focal deficit present.     Mental Status: He is alert and oriented to person, place, and time. Mental status is at baseline.  Psychiatric:        Mood and Affect: Mood normal.        Behavior: Behavior normal.     No results found for any visits on 09/14/24.      Assessment & Plan:   Problem List Items Addressed This Visit     Right ear impacted cerumen - Primary   Other Visit Diagnoses       Left ear impacted cerumen           Meds ordered this encounter  Medications   neomycin -polymyxin-hydrocortisone (CORTISPORIN ) OTIC solution    Sig: Place 4 drops into the left ear 4 (four) times daily.    Dispense:  10 mL    Refill:  3   Ears were successfully lavaged today.  Cortisporin  was ordered just in case he needs it.  Call the office if symptoms worsen or persist.  Recheck as scheduled and sooner as needed. No follow-ups on file.  Demichael Traum B Cheryln Balcom, FNP

## 2024-09-26 ENCOUNTER — Encounter: Payer: Self-pay | Admitting: Family Medicine

## 2024-09-26 NOTE — Progress Notes (Unsigned)
° ° ° °  Andrew Mcdonald T. Andrew Norell, MD, [facility] [address] [place], [address]  Phone: [phone]  FAX: [phone]  Andrew Mcdonald - 48 y.o. male  MRN [id number]  Date of Birth: 10-19-1975  Date: 09/27/2024  PCP: Watt Mirza, MD  Referral: Watt Mirza, MD  No chief complaint on file.  Subjective:   Andrew Mcdonald is a 48 y.o. very pleasant male patient with There is no height or weight on file to calculate BMI. who presents with the following:  Discussed the use of AI scribe software for clinical note transcription with the patient, who gave verbal consent to proceed.  Andrew Mcdonald presents with a dry throat and mouth. History of Present Illness     Review of Systems is noted in the HPI, as appropriate  Objective:   There were no vitals taken for this visit.  GEN: No acute distress; alert,appropriate. PULM: Breathing comfortably in no respiratory distress PSYCH: Normally interactive.   Laboratory and Imaging Data:  Assessment and Plan:   No diagnosis found. Assessment & Plan   Medication Management during today's office visit: No orders of the defined types were placed in this encounter.  There are no discontinued medications.  Orders placed today for conditions managed today: No orders of the defined types were placed in this encounter.   Disposition: No follow-ups on file.  Dragon Medical One speech-to-text software was used for transcription in this dictation.  Possible transcriptional errors can occur using Animal nutritionist.   Signed,  Mirza DASEN. Giovonnie Trettel, MD   Outpatient Encounter Medications as of 09/27/2024  Medication Sig   ALPRAZolam (XANAX) 1 MG tablet SMARTSIG:1 By Mouth 4 Times Daily PRN   amphetamine-dextroamphetamine (ADDERALL) 20 MG tablet Take by mouth as needed.   neomycin -polymyxin-hydrocortisone (CORTISPORIN ) OTIC solution Place 4 drops into the left ear 4  (four) times daily.   sertraline (ZOLOFT) 100 MG tablet Take 2 tablets by mouth daily.   No facility-administered encounter medications on file as of 09/27/2024.

## 2024-09-27 ENCOUNTER — Encounter: Payer: Self-pay | Admitting: Family Medicine

## 2024-09-27 ENCOUNTER — Ambulatory Visit: Payer: Self-pay | Admitting: Family Medicine

## 2024-09-27 VITALS — BP 110/66 | HR 77 | Temp 98.7°F | Ht 67.0 in | Wt 135.0 lb

## 2024-09-27 DIAGNOSIS — R5383 Other fatigue: Secondary | ICD-10-CM

## 2024-09-27 DIAGNOSIS — Z8042 Family history of malignant neoplasm of prostate: Secondary | ICD-10-CM

## 2024-09-27 DIAGNOSIS — R972 Elevated prostate specific antigen [PSA]: Secondary | ICD-10-CM

## 2024-09-27 DIAGNOSIS — Z13220 Encounter for screening for lipoid disorders: Secondary | ICD-10-CM

## 2024-09-27 DIAGNOSIS — Z131 Encounter for screening for diabetes mellitus: Secondary | ICD-10-CM

## 2024-09-27 DIAGNOSIS — Z125 Encounter for screening for malignant neoplasm of prostate: Secondary | ICD-10-CM

## 2024-09-27 DIAGNOSIS — K148 Other diseases of tongue: Secondary | ICD-10-CM

## 2024-09-27 NOTE — Patient Instructions (Signed)
 Allergy medicine:  Try Zyrtec (certrizine) once a day  If that does not help, try adding in flonase nasal spray.

## 2024-09-28 ENCOUNTER — Telehealth: Payer: Self-pay | Admitting: *Deleted

## 2024-09-28 ENCOUNTER — Ambulatory Visit: Payer: Self-pay | Admitting: Family Medicine

## 2024-09-28 NOTE — Telephone Encounter (Signed)
 Copied from CRM #[id number]. Topic: Clinical - Lab/Test Results >> Sep 28, 2024  3:16 PM Victoria A wrote: Reason for CRM: Patient was calling PSA results Agent informed him there were pending. Patient asked if the Nurse could call him when results are in please [phone]

## 2024-09-28 NOTE — Telephone Encounter (Signed)
 Copied from CRM #[id number]. Topic: Clinical - Lab/Test Results >> Sep 28, 2024  7:56 AM Ahlexyia S wrote: Reason for CRM: Pt is calling in about his most recent lab results. Pt is requesting a callback.

## 2024-09-28 NOTE — Telephone Encounter (Signed)
 Responded in results:  Patient requests call about his labs.  All labs normal.  PSA pending. Cholesterol, A1c/average blood sugar, kidney function, liver function, blood count all completely normal.  No concerns at all.

## 2024-09-30 NOTE — Telephone Encounter (Unsigned)
 Copied from CRM [id number]. Topic: Clinical - Lab/Test Results >> Sep 30, 2024  9:06 AM Suzen RAMAN wrote: Reason for CRM: Patient called to inquiry about PSA results, per chart notations PSA has not resulted yet. Patient would like a call back once resulted.   CB#[phone]

## 2024-09-30 NOTE — Telephone Encounter (Signed)
 PSA is still in process.  Will call patient once results are back.

## 2024-10-04 LAB — BASIC METABOLIC PANEL WITH GFR
BUN: 11 mg/dL (ref 7–25)
CO2: 28 mmol/L (ref 20–32)
Calcium: 9.8 mg/dL (ref 8.6–10.3)
Chloride: 105 mmol/L (ref 98–110)
Creat: 1.1 mg/dL (ref 0.60–1.29)
Glucose, Bld: 105 mg/dL — ABNORMAL HIGH (ref 65–99)
Potassium: 4 mmol/L (ref 3.5–5.3)
Sodium: 142 mmol/L (ref 135–146)
eGFR: 83 mL/min/1.73m2 (ref >=60)

## 2024-10-04 LAB — CBC WITH DIFFERENTIAL/PLATELET
Absolute Lymphocytes: 1321 {cells}/uL (ref 850–3900)
Absolute Monocytes: 511 {cells}/uL (ref 200–950)
Basophils Absolute: 43 {cells}/uL (ref 0–200)
Basophils Relative: 0.6 %
Eosinophils Absolute: 28 {cells}/uL (ref 15–500)
Eosinophils Relative: 0.4 %
HCT: 44.7 % (ref 39.4–51.1)
Hemoglobin: 15 g/dL (ref 13.2–17.1)
MCH: 29.6 pg (ref 27.0–33.0)
MCHC: 33.6 g/dL (ref 31.6–35.4)
MCV: 88.3 fL (ref 81.4–101.7)
MPV: 11.7 fL (ref 7.5–12.5)
Monocytes Relative: 7.2 %
Neutro Abs: 5197 {cells}/uL (ref 1500–7800)
Neutrophils Relative %: 73.2 %
Platelets: 234 Thousand/uL (ref 140–400)
RBC: 5.06 Million/uL (ref 4.20–5.80)
RDW: 12.2 % (ref 11.0–15.0)
Total Lymphocyte: 18.6 %
WBC: 7.1 Thousand/uL (ref 3.8–10.8)

## 2024-10-04 LAB — LIPID PANEL
Cholesterol: 158 mg/dL (ref <200)
HDL: 58 mg/dL (ref >=40)
LDL Cholesterol (Calc): 86 mg/dL
Non-HDL Cholesterol (Calc): 100 mg/dL (ref <130)
Total CHOL/HDL Ratio: 2.7 (calc) (ref <5.0)
Triglycerides: 57 mg/dL (ref <150)

## 2024-10-04 LAB — HEPATIC FUNCTION PANEL
AG Ratio: 2.5 (calc) (ref 1.0–2.5)
ALT: 18 U/L (ref 9–46)
AST: 23 U/L (ref 10–40)
Albumin: 4.9 g/dL (ref 3.6–5.1)
Alkaline phosphatase (APISO): 69 U/L (ref 36–130)
Bilirubin, Direct: 0.2 mg/dL (ref 0.0–0.2)
Globulin: 2 g/dL (ref 1.9–3.7)
Indirect Bilirubin: 0.6 mg/dL (ref 0.2–1.2)
Total Bilirubin: 0.8 mg/dL (ref 0.2–1.2)
Total Protein: 6.9 g/dL (ref 6.1–8.1)

## 2024-10-04 LAB — REFLEX PSA, FREE
PSA, % Free: 7 % — ABNORMAL LOW
PSA, Free: 0.3 ng/mL

## 2024-10-04 LAB — HEMOGLOBIN A1C
Hgb A1c MFr Bld: 5.4 % (ref <5.7)
Mean Plasma Glucose: 108 mg/dL
eAG (mmol/L): 6 mmol/L

## 2024-10-04 LAB — PSA, TOTAL WITH REFLEX TO PSA, FREE: PSA, Total: 4.4 ng/mL — ABNORMAL HIGH

## 2024-10-05 NOTE — Addendum Note (Signed)
 Addended by: WATT MIRZA on: 10/05/2024 09:32 AM   Modules accepted: Orders

## 2024-11-04 ENCOUNTER — Ambulatory Visit: Payer: Self-pay | Admitting: Urology
